# Patient Record
Sex: Female | Born: 1965 | Race: Black or African American | Hispanic: No | Marital: Married | Smoking: Never smoker
Health system: Southern US, Community
[De-identification: ages and names within clinical notes are randomized; demographics above are authoritative.]

## PROBLEM LIST (undated history)

## (undated) DIAGNOSIS — Z794 Long term (current) use of insulin: Secondary | ICD-10-CM

## (undated) DIAGNOSIS — A749 Chlamydial infection, unspecified: Secondary | ICD-10-CM

## (undated) DIAGNOSIS — Z862 Personal history of diseases of the blood and blood-forming organs and certain disorders involving the immune mechanism: Secondary | ICD-10-CM

## (undated) DIAGNOSIS — F419 Anxiety disorder, unspecified: Secondary | ICD-10-CM

## (undated) DIAGNOSIS — Z8619 Personal history of other infectious and parasitic diseases: Secondary | ICD-10-CM

## (undated) DIAGNOSIS — R002 Palpitations: Secondary | ICD-10-CM

## (undated) DIAGNOSIS — J40 Bronchitis, not specified as acute or chronic: Secondary | ICD-10-CM

## (undated) DIAGNOSIS — E785 Hyperlipidemia, unspecified: Secondary | ICD-10-CM

## (undated) DIAGNOSIS — E7800 Pure hypercholesterolemia, unspecified: Secondary | ICD-10-CM

## (undated) DIAGNOSIS — G43909 Migraine, unspecified, not intractable, without status migrainosus: Secondary | ICD-10-CM

## (undated) DIAGNOSIS — G5603 Carpal tunnel syndrome, bilateral upper limbs: Secondary | ICD-10-CM

## (undated) DIAGNOSIS — K219 Gastro-esophageal reflux disease without esophagitis: Secondary | ICD-10-CM

## (undated) DIAGNOSIS — B379 Candidiasis, unspecified: Secondary | ICD-10-CM

## (undated) DIAGNOSIS — D573 Sickle-cell trait: Secondary | ICD-10-CM

## (undated) DIAGNOSIS — J45909 Unspecified asthma, uncomplicated: Secondary | ICD-10-CM

## (undated) DIAGNOSIS — G4733 Obstructive sleep apnea (adult) (pediatric): Secondary | ICD-10-CM

## (undated) DIAGNOSIS — I10 Essential (primary) hypertension: Secondary | ICD-10-CM

## (undated) DIAGNOSIS — B009 Herpesviral infection, unspecified: Secondary | ICD-10-CM

## (undated) DIAGNOSIS — G2581 Restless legs syndrome: Secondary | ICD-10-CM

## (undated) DIAGNOSIS — E119 Type 2 diabetes mellitus without complications: Secondary | ICD-10-CM

## (undated) DIAGNOSIS — L309 Dermatitis, unspecified: Secondary | ICD-10-CM

## (undated) DIAGNOSIS — Z973 Presence of spectacles and contact lenses: Secondary | ICD-10-CM

## (undated) DIAGNOSIS — A549 Gonococcal infection, unspecified: Secondary | ICD-10-CM

## (undated) HISTORY — DX: Personal history of diseases of the blood and blood-forming organs and certain disorders involving the immune mechanism: Z86.2

## (undated) HISTORY — DX: Gastro-esophageal reflux disease without esophagitis: K21.9

## (undated) HISTORY — DX: Anxiety disorder, unspecified: F41.9

## (undated) HISTORY — DX: Migraine, unspecified, not intractable, without status migrainosus: G43.909

## (undated) HISTORY — PX: DILATION AND CURETTAGE OF UTERUS: SHX78

## (undated) HISTORY — DX: Pure hypercholesterolemia, unspecified: E78.00

## (undated) HISTORY — DX: Candidiasis, unspecified: B37.9

## (undated) HISTORY — DX: Essential (primary) hypertension: I10

## (undated) HISTORY — DX: Bronchitis, not specified as acute or chronic: J40

## (undated) HISTORY — PX: WISDOM TOOTH EXTRACTION: SHX21

---

## 1997-08-14 ENCOUNTER — Ambulatory Visit (HOSPITAL_COMMUNITY): Admission: RE | Admit: 1997-08-14 | Discharge: 1997-08-14 | Payer: Self-pay | Admitting: Family Medicine

## 1997-12-26 ENCOUNTER — Ambulatory Visit (HOSPITAL_COMMUNITY): Admission: RE | Admit: 1997-12-26 | Discharge: 1997-12-26 | Payer: Self-pay | Admitting: Obstetrics

## 1997-12-26 ENCOUNTER — Other Ambulatory Visit: Admission: RE | Admit: 1997-12-26 | Discharge: 1997-12-26 | Payer: Self-pay | Admitting: Obstetrics

## 1998-04-25 ENCOUNTER — Ambulatory Visit (HOSPITAL_COMMUNITY): Admission: RE | Admit: 1998-04-25 | Discharge: 1998-04-25 | Payer: Self-pay | Admitting: Obstetrics

## 1998-05-05 ENCOUNTER — Encounter: Payer: Self-pay | Admitting: Obstetrics

## 1998-05-05 ENCOUNTER — Ambulatory Visit (HOSPITAL_COMMUNITY): Admission: RE | Admit: 1998-05-05 | Discharge: 1998-05-05 | Payer: Self-pay | Admitting: Obstetrics

## 2000-10-19 ENCOUNTER — Ambulatory Visit (HOSPITAL_BASED_OUTPATIENT_CLINIC_OR_DEPARTMENT_OTHER): Admission: RE | Admit: 2000-10-19 | Discharge: 2000-10-19 | Payer: Self-pay | Admitting: Internal Medicine

## 2000-11-30 ENCOUNTER — Ambulatory Visit (HOSPITAL_BASED_OUTPATIENT_CLINIC_OR_DEPARTMENT_OTHER): Admission: RE | Admit: 2000-11-30 | Discharge: 2000-11-30 | Payer: Self-pay | Admitting: Family Medicine

## 2002-08-22 ENCOUNTER — Other Ambulatory Visit: Admission: RE | Admit: 2002-08-22 | Discharge: 2002-08-22 | Payer: Self-pay | Admitting: Family Medicine

## 2002-10-04 ENCOUNTER — Emergency Department (HOSPITAL_COMMUNITY): Admission: EM | Admit: 2002-10-04 | Discharge: 2002-10-04 | Payer: Self-pay | Admitting: Emergency Medicine

## 2003-04-11 ENCOUNTER — Ambulatory Visit (HOSPITAL_COMMUNITY): Admission: RE | Admit: 2003-04-11 | Discharge: 2003-04-11 | Payer: Self-pay | Admitting: Obstetrics

## 2003-05-27 ENCOUNTER — Ambulatory Visit (HOSPITAL_COMMUNITY): Admission: RE | Admit: 2003-05-27 | Discharge: 2003-05-27 | Payer: Self-pay | Admitting: Obstetrics

## 2003-12-27 ENCOUNTER — Inpatient Hospital Stay (HOSPITAL_COMMUNITY): Admission: AD | Admit: 2003-12-27 | Discharge: 2003-12-30 | Payer: Self-pay | Admitting: Obstetrics

## 2004-12-14 ENCOUNTER — Emergency Department (HOSPITAL_COMMUNITY): Admission: EM | Admit: 2004-12-14 | Discharge: 2004-12-14 | Payer: Self-pay | Admitting: Emergency Medicine

## 2005-09-20 ENCOUNTER — Emergency Department (HOSPITAL_COMMUNITY): Admission: EM | Admit: 2005-09-20 | Discharge: 2005-09-20 | Payer: Self-pay | Admitting: Emergency Medicine

## 2006-10-06 ENCOUNTER — Ambulatory Visit (HOSPITAL_COMMUNITY): Admission: RE | Admit: 2006-10-06 | Discharge: 2006-10-06 | Payer: Self-pay | Admitting: Obstetrics

## 2007-07-22 IMAGING — CR DG ANKLE COMPLETE 3+V*L*
3 series · 3 of 3 positions shown · non-contrast
Comparison: none

CLINICAL DATA: Patient has left ankle pain.  
 LEFT ANKLE ? 3 VIEW:

[view not recorded (1 of 3)]
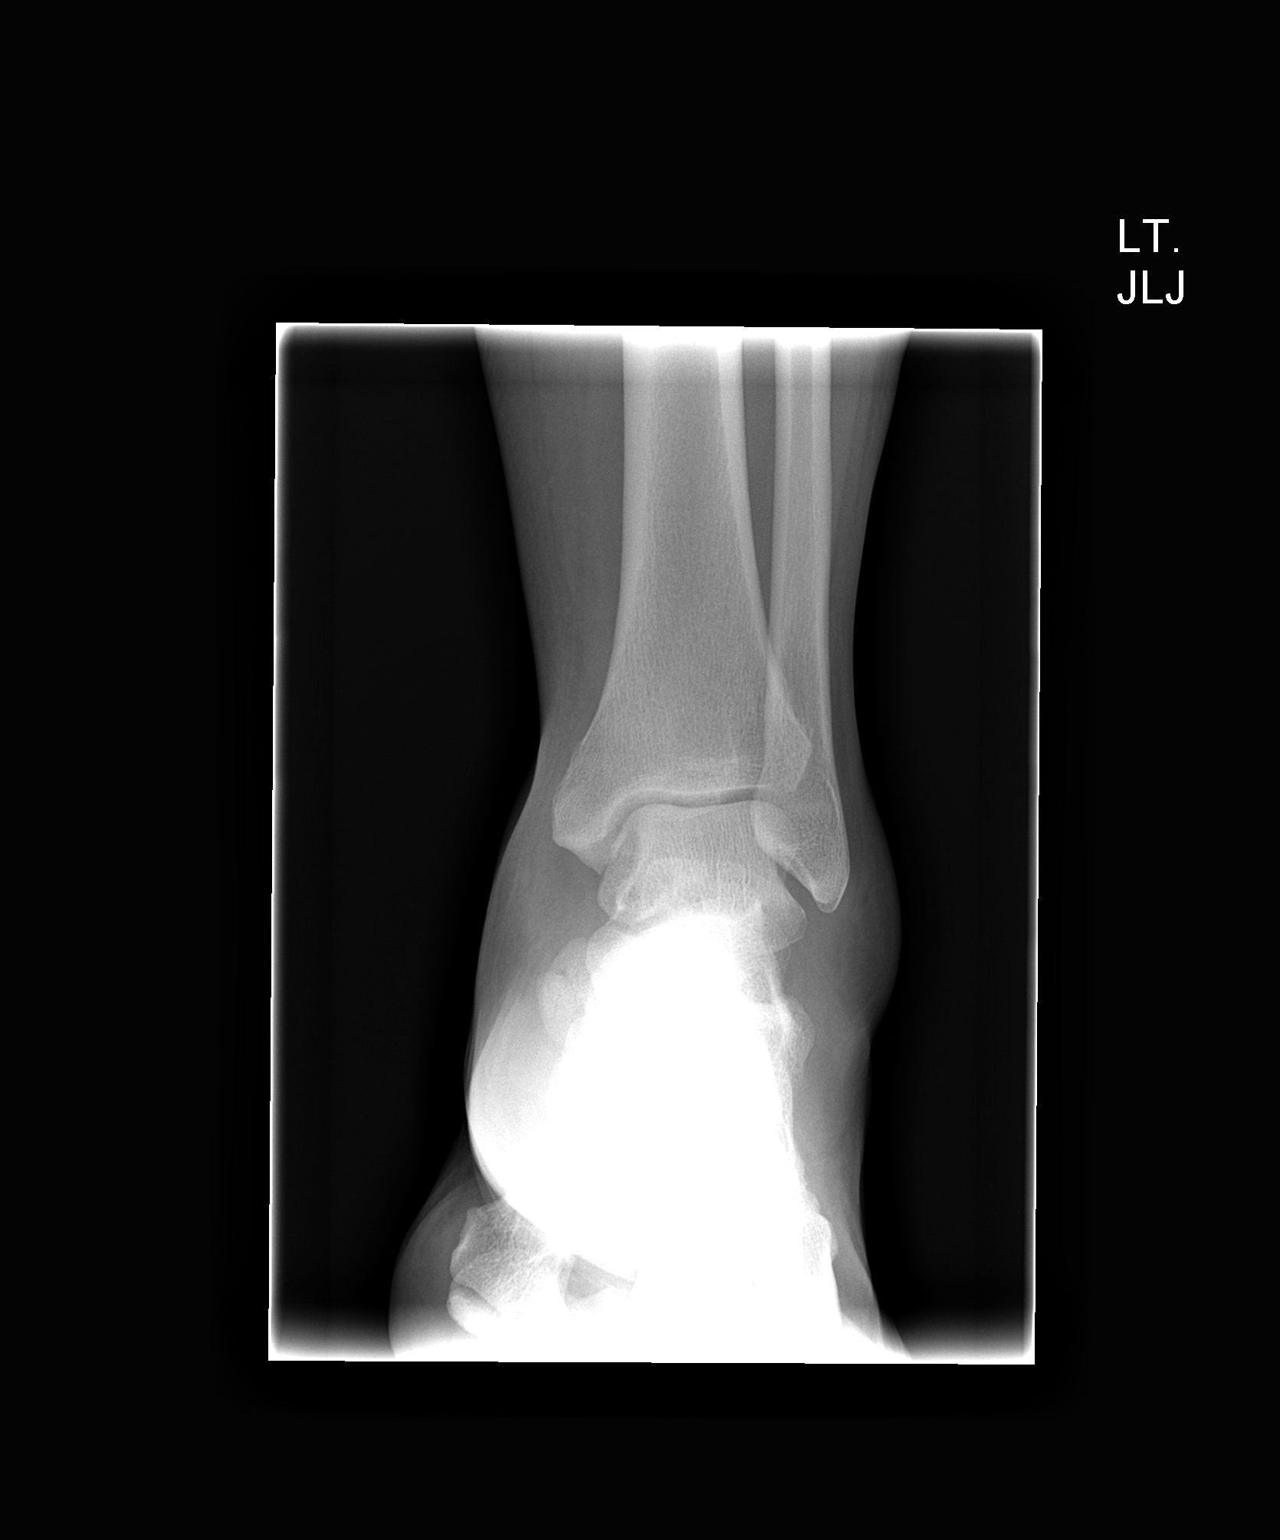

[view not recorded (2 of 3)]
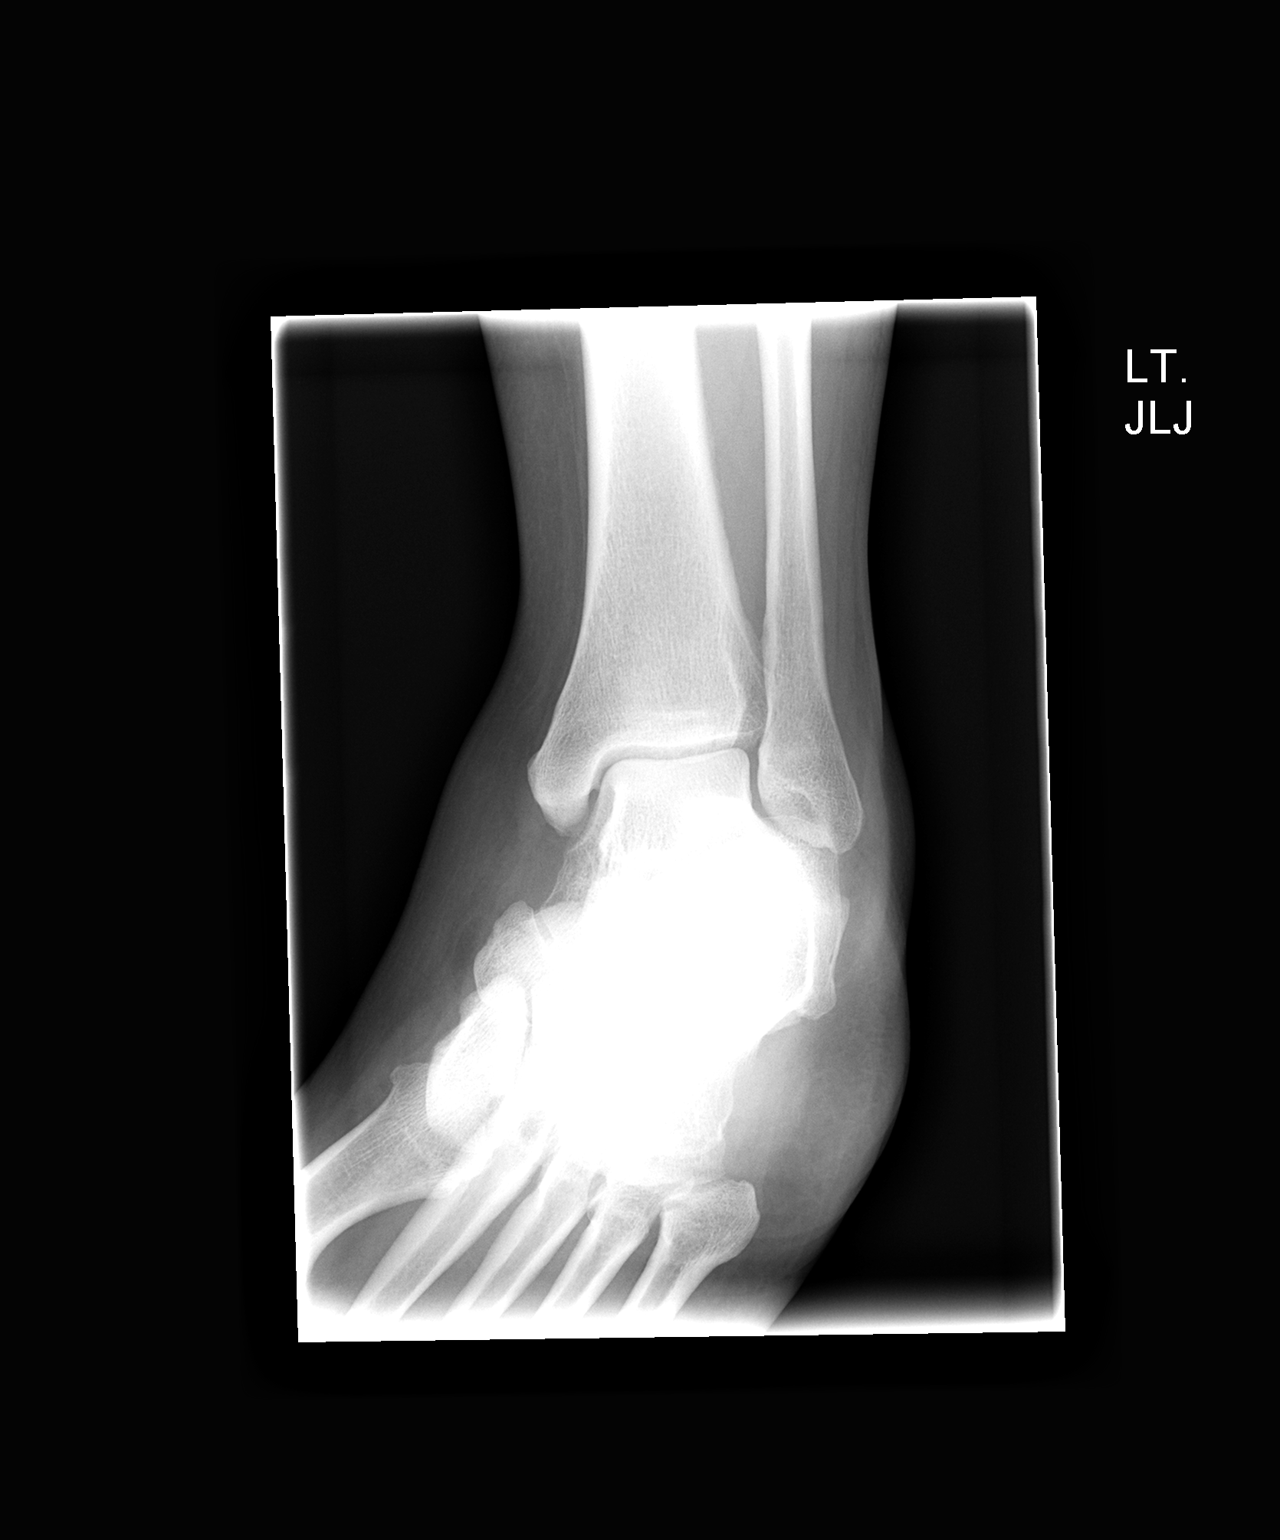

[view not recorded (3 of 3)]
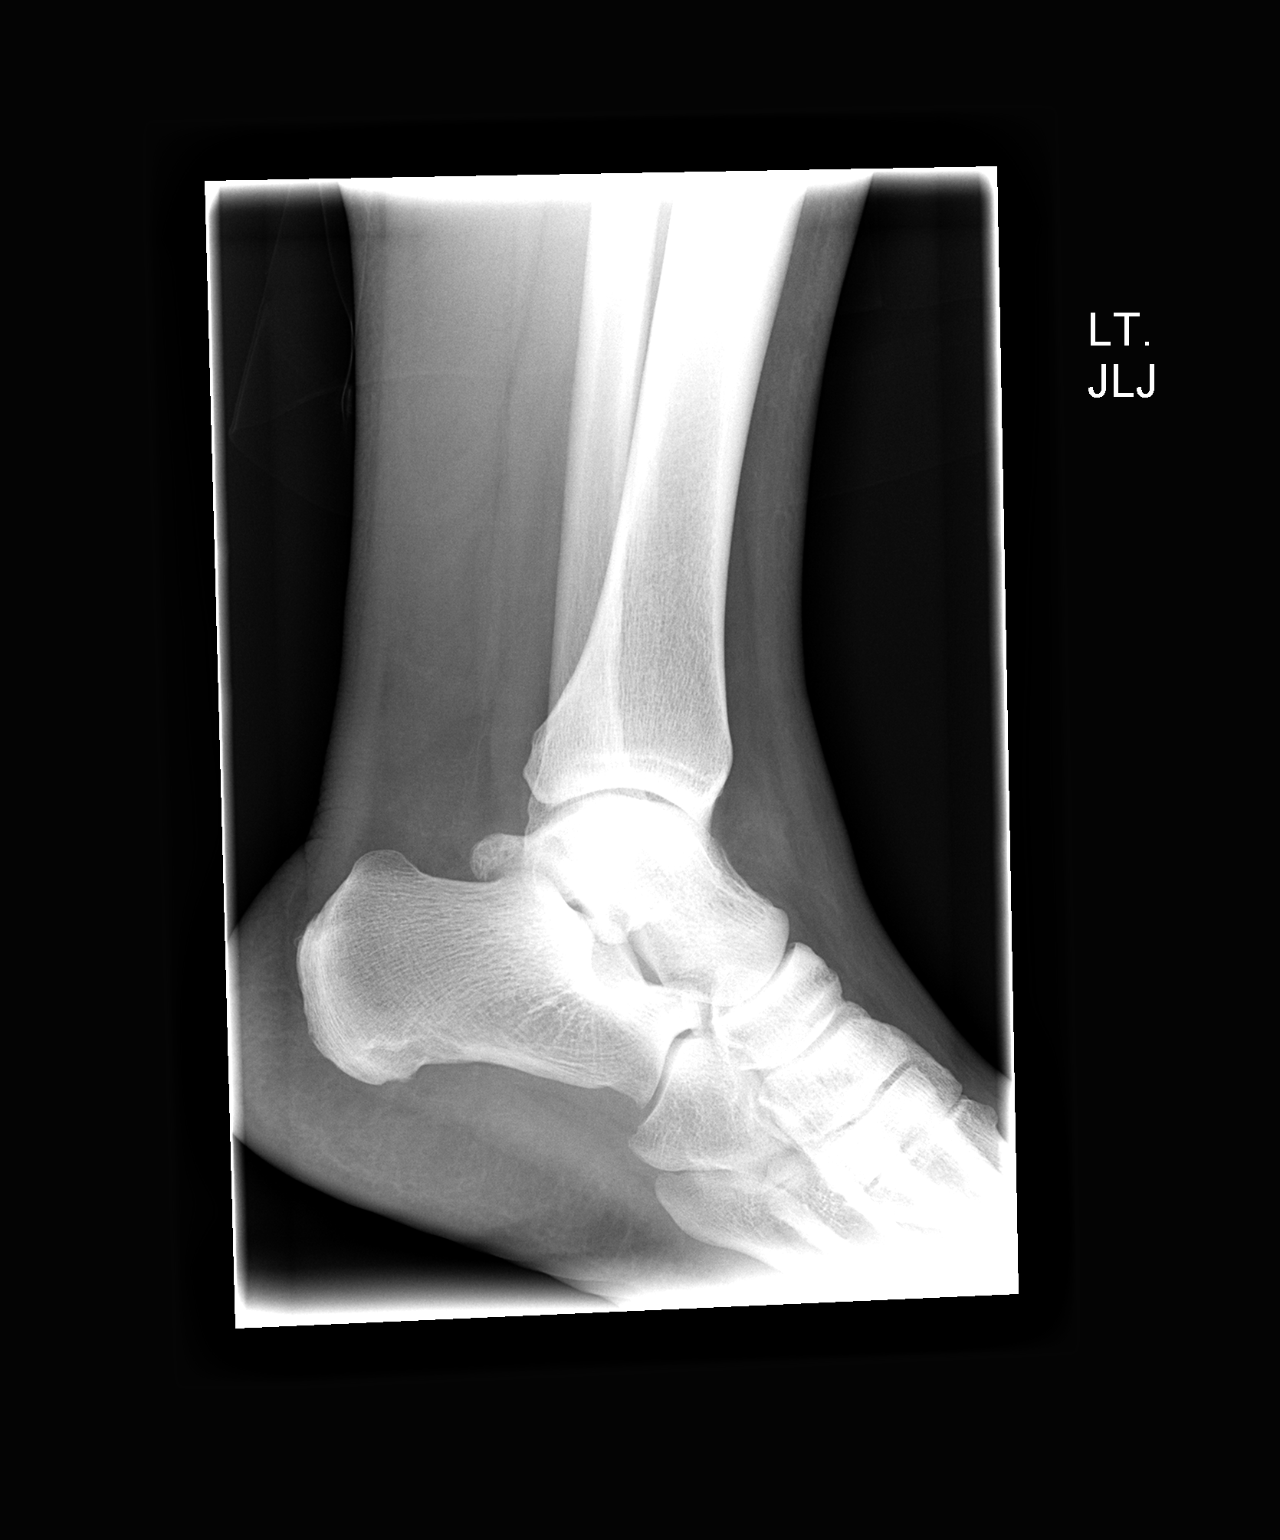

[3 of 3 positions shown; findings below may reference images not displayed]

FINDINGS: There is soft tissue swelling particularly laterally.  No evidence of fracture.
IMPRESSION: No fracture.

## 2008-10-28 ENCOUNTER — Encounter: Admission: RE | Admit: 2008-10-28 | Discharge: 2008-10-28 | Payer: Self-pay | Admitting: Family Medicine

## 2009-04-17 ENCOUNTER — Ambulatory Visit (HOSPITAL_COMMUNITY): Admission: RE | Admit: 2009-04-17 | Discharge: 2009-04-17 | Payer: Self-pay | Admitting: Obstetrics

## 2010-04-28 ENCOUNTER — Other Ambulatory Visit (HOSPITAL_COMMUNITY): Payer: Self-pay | Admitting: Obstetrics

## 2010-04-28 DIAGNOSIS — Z1231 Encounter for screening mammogram for malignant neoplasm of breast: Secondary | ICD-10-CM

## 2010-05-07 ENCOUNTER — Ambulatory Visit (HOSPITAL_COMMUNITY)
Admission: RE | Admit: 2010-05-07 | Discharge: 2010-05-07 | Disposition: A | Discharge status: Home / Self Care | Payer: BC Managed Care – PPO | Source: Ambulatory Visit | Attending: Obstetrics | Admitting: Obstetrics

## 2010-05-07 DIAGNOSIS — Z1231 Encounter for screening mammogram for malignant neoplasm of breast: Secondary | ICD-10-CM | POA: Insufficient documentation

## 2010-05-13 ENCOUNTER — Emergency Department (HOSPITAL_COMMUNITY): Payer: BC Managed Care – PPO

## 2010-05-13 ENCOUNTER — Emergency Department (HOSPITAL_COMMUNITY)
Admission: EM | Admit: 2010-05-13 | Discharge: 2010-05-13 | Disposition: A | Discharge status: Home / Self Care | Payer: BC Managed Care – PPO | Attending: Emergency Medicine | Admitting: Emergency Medicine

## 2010-05-13 DIAGNOSIS — B9789 Other viral agents as the cause of diseases classified elsewhere: Secondary | ICD-10-CM | POA: Insufficient documentation

## 2010-05-13 DIAGNOSIS — E785 Hyperlipidemia, unspecified: Secondary | ICD-10-CM | POA: Insufficient documentation

## 2010-05-13 DIAGNOSIS — R0789 Other chest pain: Secondary | ICD-10-CM | POA: Insufficient documentation

## 2010-05-13 DIAGNOSIS — E119 Type 2 diabetes mellitus without complications: Secondary | ICD-10-CM | POA: Insufficient documentation

## 2010-05-13 DIAGNOSIS — I252 Old myocardial infarction: Secondary | ICD-10-CM | POA: Insufficient documentation

## 2010-05-13 DIAGNOSIS — R509 Fever, unspecified: Secondary | ICD-10-CM | POA: Insufficient documentation

## 2010-05-13 DIAGNOSIS — J984 Other disorders of lung: Secondary | ICD-10-CM | POA: Insufficient documentation

## 2010-05-13 DIAGNOSIS — IMO0001 Reserved for inherently not codable concepts without codable children: Secondary | ICD-10-CM | POA: Insufficient documentation

## 2010-05-13 DIAGNOSIS — R5381 Other malaise: Secondary | ICD-10-CM | POA: Insufficient documentation

## 2010-05-13 LAB — POCT CARDIAC MARKERS
CKMB, poc: <1 ng/mL — ABNORMAL LOW (ref 1.0–8.0)
Myoglobin, poc: 43 ng/mL (ref 12–200)

## 2010-05-13 LAB — URINALYSIS, ROUTINE W REFLEX MICROSCOPIC
Glucose, UA: 100 mg/dL — AB
Hgb urine dipstick: NEGATIVE
Ketones, ur: NEGATIVE mg/dL
Nitrite: NEGATIVE
Protein, ur: NEGATIVE mg/dL
Urobilinogen, UA: 0.2 mg/dL (ref 0.0–1.0)

## 2010-05-13 LAB — D-DIMER, QUANTITATIVE: D-Dimer, Quant: 1.1 ug/mL-FEU — ABNORMAL HIGH (ref 0.00–0.48)

## 2010-05-13 LAB — DIFFERENTIAL
Basophils Absolute: 0 10*3/uL (ref 0.0–0.1)
Basophils Relative: 1 % (ref 0–1)
Lymphocytes Relative: 16 % (ref 12–46)
Lymphs Abs: 0.6 10*3/uL — ABNORMAL LOW (ref 0.7–4.0)
Monocytes Absolute: 0.3 10*3/uL (ref 0.1–1.0)
Monocytes Relative: 8 % (ref 3–12)
Neutro Abs: 2.6 10*3/uL (ref 1.7–7.7)
Neutrophils Relative %: 72 % (ref 43–77)

## 2010-05-13 LAB — COMPREHENSIVE METABOLIC PANEL
Albumin: 3.4 g/dL — ABNORMAL LOW (ref 3.5–5.2)
Alkaline Phosphatase: 46 U/L (ref 39–117)
CO2: 26 mEq/L (ref 19–32)
Calcium: 8.7 mg/dL (ref 8.4–10.5)
GFR calc non Af Amer: >60 mL/min (ref >60)
Sodium: 132 mEq/L — ABNORMAL LOW (ref 135–145)
Total Bilirubin: 0.3 mg/dL (ref 0.3–1.2)
Total Protein: 7.2 g/dL (ref 6.0–8.3)

## 2010-05-13 LAB — CBC
HCT: 37.6 % (ref 36.0–46.0)
Platelets: 263 10*3/uL (ref 150–400)
RDW: 13.4 % (ref 11.5–15.5)
WBC: 3.6 10*3/uL — ABNORMAL LOW (ref 4.0–10.5)

## 2010-05-13 MED ORDER — IOHEXOL 350 MG/ML SOLN
80.0000 mL | Freq: Once | INTRAVENOUS | Status: AC | PRN
  Administered 2010-05-13: 80 mL via INTRAVENOUS

## 2010-05-15 ENCOUNTER — Emergency Department (HOSPITAL_COMMUNITY)
Admission: EM | Admit: 2010-05-15 | Discharge: 2010-05-15 | Disposition: A | Discharge status: Home / Self Care | Payer: BC Managed Care – PPO | Attending: Emergency Medicine | Admitting: Emergency Medicine

## 2010-05-15 DIAGNOSIS — I252 Old myocardial infarction: Secondary | ICD-10-CM | POA: Insufficient documentation

## 2010-05-15 DIAGNOSIS — E119 Type 2 diabetes mellitus without complications: Secondary | ICD-10-CM | POA: Insufficient documentation

## 2010-05-15 DIAGNOSIS — R42 Dizziness and giddiness: Secondary | ICD-10-CM | POA: Insufficient documentation

## 2010-05-15 DIAGNOSIS — R112 Nausea with vomiting, unspecified: Secondary | ICD-10-CM | POA: Insufficient documentation

## 2010-05-15 DIAGNOSIS — R5381 Other malaise: Secondary | ICD-10-CM | POA: Insufficient documentation

## 2010-05-15 DIAGNOSIS — IMO0001 Reserved for inherently not codable concepts without codable children: Secondary | ICD-10-CM | POA: Insufficient documentation

## 2010-05-15 DIAGNOSIS — R5383 Other fatigue: Secondary | ICD-10-CM | POA: Insufficient documentation

## 2010-05-15 DIAGNOSIS — E785 Hyperlipidemia, unspecified: Secondary | ICD-10-CM | POA: Insufficient documentation

## 2010-05-15 DIAGNOSIS — R51 Headache: Secondary | ICD-10-CM | POA: Insufficient documentation

## 2010-05-19 LAB — ROCKY MTN SPOTTED FVR AB, IGG-BLOOD: RMSF IgG: 0.05 IV

## 2010-05-19 LAB — ROCKY MTN SPOTTED FVR AB, IGM-BLOOD: RMSF IgM: 0.63 IV (ref 0.00–0.89)

## 2010-07-17 NOTE — Op Note (Signed)
[name], BOUIE NO.:  [id number]   MEDICAL RECORD NO.:  [id number]          PATIENT TYPE:  INP   LOCATION:  9119                          FACILITY:  [facility]   PHYSICIAN:  Kathreen Cosier, M.D.DATE OF BIRTH:  [birth year]   DATE OF PROCEDURE:  12/27/2003  DATE OF DISCHARGE:                                 OPERATIVE REPORT   PREOPERATIVE DIAGNOSES:  Breech at 36 plus weeks with ruptured membranes,  labor.   POSTOPERATIVE DIAGNOSES:  Breech at 36 plus weeks with ruptured membranes,  labor.   SURGEON:  Kathreen Cosier, M.D.   FIRST ASSISTANT:  Charles A. Clearance Coots, M.D.   ANESTHESIA:  Spinal.   DESCRIPTION OF PROCEDURE:  The patient placed on the operating table in the  supine position after the spinal administered, the abdomen prepped and  draped, bladder emptied with a Foley catheter. A transverse suprapubic  incision made carried down to the fascia, fascia cleaned and incised the  length of the incision. The recti muscles retracted laterally, peritoneum  incised longitudinally. A transverse incision made in the visceroperitoneum  above the bladder, the bladder __________.  A transverse lower uterine  incision made and the patient was delivered of a double-footling breech  female, Apgar 5 and 9.  The fluid was clear, the team was in attendance.  The baby weighed 6 pounds 3 ounces.  The placenta was removed manually. The  uterine cavity cleaned with dry laps. The uterine incision closed with one  layer of continuous suture of #1 chromic. Hemostasis was satisfactory,  bladder flap reattached with 2-0 chromic. The uterus was well contracted,  tubes and ovaries normal, abdomen closed in layers, peritoneum continuous  suture of #0 chromic, fascia continuous suture of #0 Dexon and the skin  closed with subcuticular sutures of 4-0 Monocryl. Blood loss 500 mL.      BAM/MEDQ  D:  12/27/2003  T:  12/27/2003  Job:  [id number]

## 2010-07-17 NOTE — Discharge Summary (Signed)
NAMEYASMEN, CORTNER NO.:  [id number]   MEDICAL RECORD NO.:  [id number]          PATIENT TYPE:  INP   LOCATION:  9119                          FACILITY:  [facility]   PHYSICIAN:  Kathreen Cosier, M.D.DATE OF BIRTH:  [birth year]   DATE OF ADMISSION:  12/27/2003  DATE OF DISCHARGE:  12/30/2003                                 DISCHARGE SUMMARY   HISTORY OF PRESENT ILLNESS:  The patient is a 45 year old gravida 4, para 1-  0-2-1, [facility] January 22, 2004.  She was admitted with rupture of membranes  that occurred at 3:30 a.m. on the day of admission.  The fluid was clear.  The cervix was 1 cm, 70%, and the presenting part was floating.  It was  decided that she should have a ultrasound for presentation and ultrasound  revealed a breech presentation.  She was also contracting irregularly.  She  had a positive GBS.   LABORATORY DATA:  On admission her hemoglobin 12.2, platelets 299, and white  count 11.1.  RPR negative.  Urinalysis negative.   HOSPITAL COURSE:  The patient was delivered by primary low transverse  cesarean section because of breech presentation with rupture of membranes at  term.  She did have a double footling breech female Apgars 5 and 9 weighing  6 pounds 3 ounces.  Postoperatively, she did well, her hemoglobin was 10,  and she was discharged on the third postoperative day ambulatory and on a  regular diet, to see me in six weeks.   DISCHARGE MEDICATIONS:  1.  Micronor one p.o. daily.  2.  Tylox.   DISCHARGE DIAGNOSES:  1.  Status post primary low transverse cesarean section at 38 weeks.  2.  Premature rupture of membranes.  3.  Labor.  4.  Double footling breech.      BAM/MEDQ  D:  12/30/2003  T:  12/30/2003  Job:  [id number]

## 2010-07-17 NOTE — H&P (Signed)
NAMEAVELYNN, Angela Mitchell NO.:  [id number]   MEDICAL RECORD NO.:  [id number]          PATIENT TYPE:  INP   LOCATION:  9119                          [facility]ACILITY:  [facility]   PHYSICIAN:  Kathreen Cosier, M.D.DATE OF BIRTH:  03/09/1965   DATE OF ADMISSION:  12/27/2003  DATE OF DISCHARGE:                                HISTORY & PHYSICAL   HISTORY OF PRESENT ILLNESS:  The patient is a 45 year old gravida 4 para 1-0-  2-1 with [facility] January 22, 2004.  Her membranes ruptured spontaneously at  3:30 a.m. today; the fluid was clear.  On admission, she was having  occasional contractions.  The cervix was 1 cm, 70%, and the presenting part  was floating.  An ultrasound was performed for the presentation and it  revealed a footling breech.  She had a positive GBS and she received  penicillin.  Because of the breech presentation and ruptured membranes in  early labor, it was decided she would be delivered by C-section.   PHYSICAL EXAMINATION:  GENERAL:  Revealed a well-developed female weighing  238 pounds.  HEENT:  Negative.  LUNGS:  Clear.  HEART:  Regular rhythm, no murmurs, no gallops.  BREASTS:  No masses.  ABDOMEN:  Term size.  Fetal heart 153.  PELVIC:  As described above.  EXTREMITIES:  Negative.      BAM/MEDQ  D:  12/27/2003  T:  12/27/2003  Job:  [id number]

## 2010-11-13 ENCOUNTER — Other Ambulatory Visit: Payer: Self-pay | Admitting: Internal Medicine

## 2010-11-13 DIAGNOSIS — R911 Solitary pulmonary nodule: Secondary | ICD-10-CM

## 2010-11-18 ENCOUNTER — Ambulatory Visit
Admission: RE | Admit: 2010-11-18 | Discharge: 2010-11-18 | Disposition: A | Discharge status: Home / Self Care | Payer: BC Managed Care – PPO | Source: Ambulatory Visit | Attending: Internal Medicine | Admitting: Internal Medicine

## 2010-11-18 DIAGNOSIS — R911 Solitary pulmonary nodule: Secondary | ICD-10-CM

## 2011-03-02 DIAGNOSIS — G43909 Migraine, unspecified, not intractable, without status migrainosus: Secondary | ICD-10-CM

## 2011-03-02 HISTORY — DX: Migraine, unspecified, not intractable, without status migrainosus: G43.909

## 2011-03-02 HISTORY — PX: DILATION AND CURETTAGE OF UTERUS: SHX78

## 2011-03-10 ENCOUNTER — Other Ambulatory Visit (HOSPITAL_COMMUNITY): Payer: Self-pay | Admitting: Obstetrics

## 2011-03-10 DIAGNOSIS — O3680X0 Pregnancy with inconclusive fetal viability, not applicable or unspecified: Secondary | ICD-10-CM

## 2011-03-15 ENCOUNTER — Ambulatory Visit (HOSPITAL_COMMUNITY)
Admission: RE | Admit: 2011-03-15 | Discharge: 2011-03-15 | Disposition: A | Discharge status: Home / Self Care | Payer: BC Managed Care – PPO | Source: Ambulatory Visit | Attending: Obstetrics | Admitting: Obstetrics

## 2011-03-15 DIAGNOSIS — Z3689 Encounter for other specified antenatal screening: Secondary | ICD-10-CM | POA: Insufficient documentation

## 2011-03-15 DIAGNOSIS — O3680X0 Pregnancy with inconclusive fetal viability, not applicable or unspecified: Secondary | ICD-10-CM

## 2011-03-17 ENCOUNTER — Other Ambulatory Visit (HOSPITAL_COMMUNITY): Payer: Self-pay | Admitting: Obstetrics

## 2011-03-17 DIAGNOSIS — O3680X0 Pregnancy with inconclusive fetal viability, not applicable or unspecified: Secondary | ICD-10-CM

## 2011-03-25 ENCOUNTER — Ambulatory Visit (HOSPITAL_COMMUNITY)
Admission: RE | Admit: 2011-03-25 | Discharge: 2011-03-25 | Disposition: A | Discharge status: Home / Self Care | Payer: BC Managed Care – PPO | Source: Ambulatory Visit | Attending: Obstetrics | Admitting: Obstetrics

## 2011-03-25 ENCOUNTER — Encounter: Payer: BC Managed Care – PPO | Attending: Endocrinology | Admitting: Dietician

## 2011-03-25 ENCOUNTER — Encounter: Payer: Self-pay | Admitting: Dietician

## 2011-03-25 DIAGNOSIS — O24919 Unspecified diabetes mellitus in pregnancy, unspecified trimester: Secondary | ICD-10-CM | POA: Insufficient documentation

## 2011-03-25 DIAGNOSIS — Z713 Dietary counseling and surveillance: Secondary | ICD-10-CM | POA: Insufficient documentation

## 2011-03-25 DIAGNOSIS — E119 Type 2 diabetes mellitus without complications: Secondary | ICD-10-CM | POA: Insufficient documentation

## 2011-03-25 DIAGNOSIS — O3680X0 Pregnancy with inconclusive fetal viability, not applicable or unspecified: Secondary | ICD-10-CM

## 2011-03-25 DIAGNOSIS — Z3689 Encounter for other specified antenatal screening: Secondary | ICD-10-CM | POA: Insufficient documentation

## 2011-03-25 NOTE — Progress Notes (Signed)
Has DM type 2 and is diagnosed with pregnancy.  HgA1C was "7 something in November and now with pregnancy she is >9.5%".  She is stressed that she wanted the pregnancy and was not aware that she needed to have the low A1C and to be working with her physician to closely monitor glucose levels to prevent the issues involved in fetal growth that can accompany high glucose levels.  We reviewed the diet and placed her on 30 gm of carb for meals and 15 gm of carb for snacks.  This is using a 3 meals and 3 snack meal pattern and monitoring her blood glucose levels at the fasting and 2 hours after the first bite of each meal.  She is currently on Glumetza 100 mg BID, Humalog mix insulin 75/25; 15 units before breakfast and 20 units before dinner.  She does not like milk and I suggested a calcium, 600 mg with vitamin D twice daily.  Patient was seen on 03/25/2011 for  Diabetes and Pregnancy self-management 1:1 session at [facility]. The following learning objectives were met by the patient during this course:   States the definition of Gestational Diabetes  States why dietary management is important in controlling blood glucose  Describes the effects each nutrient has on blood glucose levels  Demonstrates ability to create a balanced meal plan  Demonstrates carbohydrate counting   States when to check blood glucose levels  Demonstrates proper blood glucose monitoring techniques  States the effect of stress and exercise on blood glucose levels  States the importance of limiting caffeine and abstaining from alcohol and smoking  Blood glucose monitor given: Has her own meter a One Touch Ultra 2.  Does need a prescription for strips and lancets.  To call Dr. Gaynell Face tomorrow for the prescription.  Patient instructed to monitor glucose levels: Fasting and 2 hours after the first bite of every meal. FBS: 60 - <90 1 hour: <140 2 hour: <120  Patient received  handouts:  Nutrition Diabetes and Pregnancy  Carbohydrate Counting List  Patient will be seen for follow-up as needed.

## 2011-03-29 ENCOUNTER — Other Ambulatory Visit (HOSPITAL_COMMUNITY): Payer: Self-pay | Admitting: Obstetrics

## 2011-03-29 DIAGNOSIS — O3680X0 Pregnancy with inconclusive fetal viability, not applicable or unspecified: Secondary | ICD-10-CM

## 2011-04-03 ENCOUNTER — Inpatient Hospital Stay (HOSPITAL_COMMUNITY): Payer: BC Managed Care – PPO

## 2011-04-03 ENCOUNTER — Inpatient Hospital Stay (HOSPITAL_COMMUNITY)
Admission: AD | Admit: 2011-04-03 | Discharge: 2011-04-03 | Disposition: A | Discharge status: Home / Self Care | Payer: BC Managed Care – PPO | Source: Ambulatory Visit | Attending: Obstetrics | Admitting: Obstetrics

## 2011-04-03 ENCOUNTER — Other Ambulatory Visit: Payer: Self-pay | Admitting: Obstetrics

## 2011-04-03 ENCOUNTER — Encounter (HOSPITAL_COMMUNITY): Payer: Self-pay | Admitting: *Deleted

## 2011-04-03 DIAGNOSIS — O039 Complete or unspecified spontaneous abortion without complication: Secondary | ICD-10-CM

## 2011-04-03 DIAGNOSIS — O034 Incomplete spontaneous abortion without complication: Secondary | ICD-10-CM | POA: Insufficient documentation

## 2011-04-03 HISTORY — DX: Chlamydial infection, unspecified: A74.9

## 2011-04-03 HISTORY — DX: Herpesviral infection, unspecified: B00.9

## 2011-04-03 HISTORY — DX: Gonococcal infection, unspecified: A54.9

## 2011-04-03 LAB — CBC
MCH: 25.9 pg — ABNORMAL LOW (ref 26.0–34.0)
MCHC: 32.1 g/dL (ref 30.0–36.0)
MCV: 80.5 fL (ref 78.0–100.0)
Platelets: 401 10*3/uL — ABNORMAL HIGH (ref 150–400)
RBC: 4.37 MIL/uL (ref 3.87–5.11)

## 2011-04-03 MED ORDER — RHO D IMMUNE GLOBULIN 1500 UNIT/2ML IJ SOLN
300.0000 ug | Freq: Once | INTRAMUSCULAR | Status: AC
  Administered 2011-04-03: 300 ug via INTRAMUSCULAR

## 2011-04-03 MED ORDER — ACETAMINOPHEN-CODEINE #3 300-30 MG PO TABS
2.0000 | ORAL_TABLET | ORAL | Status: DC | PRN
  Administered 2011-04-03: 2 via ORAL
  Filled 2011-04-03: qty 2

## 2011-04-03 NOTE — ED Provider Notes (Signed)
Chief Complaint:  Vaginal Bleeding   Angela Mitchell is  46 y.o. [id number].  Patient's last menstrual period was 02/03/2011.Marland Kitchen  Her pregnancy status is positive.  She presents complaining of Vaginal Bleeding .  Began having slight bleeding described as a brown smudge yesterday and went home form work on bedrest. Today at 1300 began having severe lower abdominal cramping and more bleeding which became heavy with clots en route to hospital. No orthostatic sx.   On 03/15/2011 she was seen here for an ultrasound due to unknown dating. She had a positive gestational sac, positive yolk sac, no embryo. Sac size was consistent with 5 weeks 4 days. Then on 03/24/2010 she had a followup ultrasound when she should have been 7 weeks by her initial ultrasound the gestational sac was consistent with 6 week 1 day and there was no yolk sac, double decidual sign and no fetal pole.   Past Medical History  Diagnosis Date  . Diabetes mellitus   . Chlamydia   . Gonorrhea   . Herpes     Past Surgical History  Procedure Date  . Cesarean section   . Dilation and curettage of uterus   . Wisdom tooth extraction     Family History  Problem Relation Age of Onset  . Diabetes Mother   . Hypertension Father   . Hyperlipidemia Father   . Diabetes Maternal Grandmother     History  Substance Use Topics  . Smoking status: Never Smoker   . Smokeless tobacco: Never Used  . Alcohol Use: No    Allergies: No Known Allergies  Prescriptions prior to admission  Medication Sig Dispense Refill  . insulin lispro protamine-insulin lispro (HUMALOG 75/25) (75-25) 100 UNIT/ML SUSP Inject 20 Units into the skin daily with breakfast. Humalog mix, 20 units before breakfast and 25 units before dinner      . metFORMIN (GLUMETZA) 1000 MG (MOD) 24 hr tablet Take 1,000 mg by mouth 2 (two) times daily with a meal.      . Prenatal Vit-Fe Fumarate-FA (PRENATAL MULTIVITAMIN) TABS Take 1 tablet by mouth daily.        Review of  Systems - Negative except as noted  Physical Exam   Blood pressure 104/59, pulse 71, temperature 98.8 F (37.1 C), temperature source Oral, resp. rate 18, height 5\' 3"  (1.6 m), weight 113.581 kg (250 lb 6.4 oz), last menstrual period 02/03/2011.  General: General appearance - alert, well appearing, and in no distress Abdomen - soft, tenderness noted suprapubic Pelvic , SPEC: moderate size clots at introitus, several 3-4 cm clots removed via sponge forceps,4 cm clot with small fragment white tissue Cx: 1 cm/ long Extremities - peripheral pulses normal, no pedal edema, no clubbing or cyanosis, no pedal edema noted   Results for orders placed during the hospital encounter of 04/03/11 (from the past 24 hour(s))  CBC     Status: Abnormal   Collection Time   04/03/11  3:18 PM      Component Value Range   WBC 11.1 (*) 4.0 - 10.5 (K/uL)   RBC 4.37  3.87 - 5.11 (MIL/uL)   Hemoglobin 11.3 (*) 12.0 - 15.0 (g/dL)   HCT 45.4 (*) 09.8 - 46.0 (%)   MCV 80.5  78.0 - 100.0 (fL)   MCH 25.9 (*) 26.0 - 34.0 (pg)   MCHC 32.1  30.0 - 36.0 (g/dL)   RDW 11.9  14.7 - 82.9 (%)   Platelets 401 (*) 150 - 400 (K/uL)  HCG, QUANTITATIVE,  PREGNANCY     Status: Abnormal   Collection Time   04/03/11  3:19 PM      Component Value Range   hCG, Beta Chain, Quant, S 5968 (*) <5 (mIU/mL)    ABO/RH pending  Assessment: 46 yo [id number] @[redacted]w[redacted]d  by initial [place] Incomplete SAB, hemodynamically stable ll DM  Plan: C/W Dr. Gaynell Face who will come see pt.  Edwyna Dangerfield

## 2011-04-03 NOTE — Progress Notes (Signed)
Pt reports having some vaginal bleeding on Thursday brown and mucusy. Bleeding and cramping got worse today. More dark red and bloody that started today and passed a large clot in BR while she was waiting to be triaged.

## 2011-04-03 NOTE — Progress Notes (Signed)
Had u/s and is being followed in Dr. Elsie Stain office, and is to have a repeat u/s on this coming Tuesday.  Brown/red bleeding when wiping yesterday, was told to rest all day.  Bleeding mod amt bright red at this time.

## 2011-04-04 LAB — RH IG WORKUP (INCLUDES ABO/RH)
ABO/RH(D): A NEG
Antibody Screen: NEGATIVE
Fetal Screen: NEGATIVE
Gestational Age(Wks): 22.3
Unit division: 0

## 2011-04-06 ENCOUNTER — Ambulatory Visit (HOSPITAL_COMMUNITY): Payer: BC Managed Care – PPO | Attending: Obstetrics

## 2011-07-28 ENCOUNTER — Ambulatory Visit: Payer: BC Managed Care – PPO | Admitting: Dietician

## 2011-09-21 ENCOUNTER — Ambulatory Visit (INDEPENDENT_AMBULATORY_CARE_PROVIDER_SITE_OTHER): Payer: BC Managed Care – PPO | Admitting: Obstetrics and Gynecology

## 2011-09-21 ENCOUNTER — Encounter: Payer: Self-pay | Admitting: Obstetrics and Gynecology

## 2011-09-21 VITALS — BP 140/80 | HR 82 | Ht 63.5 in | Wt 250.0 lb

## 2011-09-21 DIAGNOSIS — R102 Pelvic and perineal pain: Secondary | ICD-10-CM

## 2011-09-21 DIAGNOSIS — N949 Unspecified condition associated with female genital organs and menstrual cycle: Secondary | ICD-10-CM

## 2011-09-21 DIAGNOSIS — Z01419 Encounter for gynecological examination (general) (routine) without abnormal findings: Secondary | ICD-10-CM

## 2011-09-21 DIAGNOSIS — N926 Irregular menstruation, unspecified: Secondary | ICD-10-CM

## 2011-09-21 DIAGNOSIS — Z124 Encounter for screening for malignant neoplasm of cervix: Secondary | ICD-10-CM

## 2011-09-21 NOTE — Progress Notes (Addendum)
Subjective:    Angela Mitchell is a 46 y.o. female, [phone], who presents for an annual exam. The patient reports a miscarriage earlier this year and was on BCPs. She states that she as grieved this loss.  Recently diagnosed with migraine with aura, has stopped oral contraceptives and therefore considering other methods. Reviewed Paragard and Mirena IUDs, MOA, effectiveness, risks to include expulsion and perforation, side effects and duration of action.  Menstrual cycle:   LMP: Patient's last menstrual period was 09/05/2010.             Review of Systems Pertinent items are noted in HPI. Denies pelvic pain, urinary tract symptoms, vaginitis symptoms, irregular bleeding, menopausal symptoms, change in bowel habits or rectal bleeding   Objective:    BP 140/80  Pulse 82  Ht 5' 3.5" (1.613 m)  Wt 250 lb (113.399 kg)  BMI 43.59 kg/m2  LMP 09/05/2010  Breastfeeding? Unknown  Wt Readings from Last 1 Encounters:  09/21/11 250 lb (113.399 kg)   Body mass index is 43.59 kg/(m^2). General Appearance: Alert, no acute distress HEENT: Grossly normal Neck / Thyroid: Supple, no thyromegaly or cervical adenopathy Lungs: Clear to auscultation bilaterally Back: No CVA tenderness Breast Exam: No masses or nodes.No dimpling, nipple retraction or discharge. Cardiovascular: Regular rate and rhythm.  Gastrointestinal: Soft, non-tender, no masses or organomegaly Pelvic Exam: EGBUS-wnl, vagina-normal rugae, cervix- without lesions or tenderness, uterus appears normal size shape and consistency, exam limited by habitus,  adnexae-no masses or tenderness Rectovaginal: no masses and normal sphincter tone Lymphatic Exam: Non-palpable nodes in neck, clavicular,  axillary, or inguinal regions  Skin: no rashes or abnormalities Extremities: no clubbing cyanosis or edema  Neurologic: grossly normal Psychiatric: Alert and oriented  UPT-negative   Assessment:   Routine GYN Exam Need for  Contraception Dysmenorrhea (new) S/P SAB   Plan:  Handout on Methods f Contraception, IUD Information Form and Mirena IUD and patient would like to have the Mirena IUD Use condoms or abstinence until appointment for IUD insertion  PAP sent  RTO 1 year or prn  Jamell Opfer,ELMIRAPA-C

## 2011-09-21 NOTE — Progress Notes (Signed)
Regular Periods: no Mammogram: yes  Monthly Breast Ex.: no Exercise: no  Tetanus < 10 years: no Seatbelts: yes  NI. Bladder Functn.: yes Abuse at home: no  Daily BM's: yes Stressful Work: yes  Healthy Diet: yes Sigmoid-Colonoscopy: NO  Calcium: no Medical problems this year: CONSULT ABOUT IUD   LAST PAP2013  Contraception: NONE; PT HAD MISCARRIAGE IN 2/13  Mammogram:  2012   PCP: DR. Clint Lipps  PMH: NO CHANGE  FMH: NO CHANGE  Last Bone Scan: NO

## 2011-09-21 NOTE — Patient Instructions (Signed)
Take Citracal or Caltrate Calcium/Magnesium/Vitamin D twice daily

## 2011-09-23 LAB — PAP IG, CT-NG, RFX HPV ASCU
Chlamydia Probe Amp: NEGATIVE
GC Probe Amp: NEGATIVE

## 2011-10-06 ENCOUNTER — Telehealth: Payer: Self-pay | Admitting: Obstetrics and Gynecology

## 2011-10-06 NOTE — Telephone Encounter (Signed)
Lm on vm tcb rgd msg 

## 2011-10-06 NOTE — Telephone Encounter (Signed)
Spoke with pt rgd msg pt states schd for iud insertion tomorrow but had unprotected intercourse on Monday advise pt need to wait 2 weeks of no intercourse pt has appt 10/25/11 3:30 for u/s and 4:00 iud insertion

## 2011-10-07 ENCOUNTER — Other Ambulatory Visit: Payer: BC Managed Care – PPO

## 2011-10-07 ENCOUNTER — Encounter: Payer: BC Managed Care – PPO | Admitting: Obstetrics and Gynecology

## 2011-10-22 ENCOUNTER — Other Ambulatory Visit: Payer: Self-pay | Admitting: Internal Medicine

## 2011-10-22 DIAGNOSIS — R911 Solitary pulmonary nodule: Secondary | ICD-10-CM

## 2011-10-25 ENCOUNTER — Ambulatory Visit (INDEPENDENT_AMBULATORY_CARE_PROVIDER_SITE_OTHER): Payer: BC Managed Care – PPO | Admitting: Obstetrics and Gynecology

## 2011-10-25 ENCOUNTER — Ambulatory Visit (INDEPENDENT_AMBULATORY_CARE_PROVIDER_SITE_OTHER): Payer: BC Managed Care – PPO

## 2011-10-25 ENCOUNTER — Encounter: Payer: Self-pay | Admitting: Obstetrics and Gynecology

## 2011-10-25 VITALS — BP 118/72 | Wt 245.0 lb

## 2011-10-25 DIAGNOSIS — N949 Unspecified condition associated with female genital organs and menstrual cycle: Secondary | ICD-10-CM

## 2011-10-25 DIAGNOSIS — IMO0001 Reserved for inherently not codable concepts without codable children: Secondary | ICD-10-CM

## 2011-10-25 DIAGNOSIS — R102 Pelvic and perineal pain: Secondary | ICD-10-CM

## 2011-10-25 DIAGNOSIS — Z30430 Encounter for insertion of intrauterine contraceptive device: Secondary | ICD-10-CM

## 2011-10-25 DIAGNOSIS — Z975 Presence of (intrauterine) contraceptive device: Secondary | ICD-10-CM

## 2011-10-25 MED ORDER — LEVONORGESTREL 20 MCG/24HR IU IUD
INTRAUTERINE_SYSTEM | Freq: Once | INTRAUTERINE | Status: AC
  Administered 2011-10-25: 1 via INTRAUTERINE

## 2011-10-25 NOTE — Addendum Note (Signed)
Addended byWinfred Leeds on: 10/25/2011 05:04 PM   Modules accepted: Orders

## 2011-10-25 NOTE — Patient Instructions (Addendum)
Keep follow up appointment in 4 weeks  Call [facility] [phone]:  -for temperature of 100.4 degrees Fahrenheit or more -pain not improved with over the counter pain medications (Ibuprofen, Advil, Aleve,        Tylenol or acetaminophen) -for excessive bleeding (more than a usual period) -for any other concerns  Do not place anything in your vagina for the next 7 days  May take Ibuprofen 200 mg 3 tablets every 6 hours with food as needed for pain

## 2011-10-25 NOTE — Progress Notes (Signed)
IUD INSERTION NOTE  Angela Mitchell is a 46 y.o. female [phone] who presents for IUD insertion. Patient also had an ultrasound to evaluate a history of pelvic pain that was described as  "grand mal" spasms  O:  U/S-uterus- 8.23 x 5.44 x 4.60 cm (retroverted) with normal appearing ovaries/adnexae and no fluid in the cul-de-sac  Consent signed after risks and benefits were reviewed including but not limited to bleeding, infection, expulsion and risk of uterine perforation that may require an additional procedure for removal.  LMP: Patient's last menstrual period was 10/04/2010. UPT: negative  Mirena Lot # [id number]  Uterus assessed for size and position Prepped with Betadine Tenaculum placed on anterior lip of cervix after Hurricane gel was applied Uterus sounded at  8 cm Insertion of MIRENA IUD per protocol without any complications Strings trimmed   Assessment:  IUD Insertion                        H/O Pelvic Pain   Plan:  1. Patient instructed to call with oral temperature of 100.4 degrees Fahrenheit or more, excessive bleeding or pain that is not relieved with OTC analgesia taken as directed  2. Patient instructed on how  to check IUD strings and encouraged to do so after each menstrual cycle  3. Advised not to place anything in vagina or have sexual intercourse for 7 days  4. Follow-up: 4  weeks   5. Reviewed causes of pelvic pain: urogenital, previous surgery, gastrointestinal and musculoskeletal.  6. Follow up with PCP for non-GYN causes of pelvic pain.    Angela Ulatowski PA-C 10/25/2011 4:23 PM

## 2011-10-28 ENCOUNTER — Ambulatory Visit
Admission: RE | Admit: 2011-10-28 | Discharge: 2011-10-28 | Disposition: A | Discharge status: Home / Self Care | Payer: BC Managed Care – PPO | Source: Ambulatory Visit | Attending: Internal Medicine | Admitting: Internal Medicine

## 2011-10-28 DIAGNOSIS — R911 Solitary pulmonary nodule: Secondary | ICD-10-CM

## 2011-11-22 ENCOUNTER — Encounter: Payer: BC Managed Care – PPO | Admitting: Obstetrics and Gynecology

## 2011-11-23 ENCOUNTER — Encounter: Payer: Self-pay | Admitting: Obstetrics and Gynecology

## 2011-11-23 ENCOUNTER — Ambulatory Visit (INDEPENDENT_AMBULATORY_CARE_PROVIDER_SITE_OTHER): Payer: BC Managed Care – PPO | Admitting: Obstetrics and Gynecology

## 2011-11-23 VITALS — BP 118/72 | HR 74 | Wt 250.0 lb

## 2011-11-23 DIAGNOSIS — Z30431 Encounter for routine checking of intrauterine contraceptive device: Secondary | ICD-10-CM

## 2011-11-23 NOTE — Progress Notes (Signed)
46 YO with Mirena IUD insertion 10/25/11 for follow up. States no bleeding since the first 2 weeks (pad change twice a day).  Had a period 11/02/11 with bleeding/spotting x 3 weeks with use of 5 pads on heaviest days during period.  Cramps 7/10 were relieved with Ibuprofen.   O: Abdomen: soft, non-tender       Pelvic: EGBUS-wnl, vagina-normal, cervix-string visible, uterus-normal, adnexae-no tenderness or masses  A: IUD Follow up  P: RTO-as scheduled or prn  Leili Eskenazi, PA-C

## 2012-02-10 ENCOUNTER — Other Ambulatory Visit: Payer: Self-pay | Admitting: Pain Medicine

## 2012-02-10 DIAGNOSIS — M542 Cervicalgia: Secondary | ICD-10-CM

## 2012-02-17 ENCOUNTER — Ambulatory Visit
Admission: RE | Admit: 2012-02-17 | Discharge: 2012-02-17 | Disposition: A | Discharge status: Home / Self Care | Payer: BC Managed Care – PPO | Source: Ambulatory Visit | Attending: Pain Medicine | Admitting: Pain Medicine

## 2012-02-17 DIAGNOSIS — M542 Cervicalgia: Secondary | ICD-10-CM

## 2013-12-31 ENCOUNTER — Encounter: Payer: Self-pay | Admitting: Obstetrics and Gynecology

## 2014-01-15 ENCOUNTER — Other Ambulatory Visit (HOSPITAL_COMMUNITY): Payer: Self-pay | Admitting: Obstetrics

## 2014-01-15 ENCOUNTER — Other Ambulatory Visit (HOSPITAL_BASED_OUTPATIENT_CLINIC_OR_DEPARTMENT_OTHER): Payer: Self-pay | Admitting: Obstetrics

## 2014-01-15 DIAGNOSIS — Z1231 Encounter for screening mammogram for malignant neoplasm of breast: Secondary | ICD-10-CM

## 2014-01-25 ENCOUNTER — Ambulatory Visit (HOSPITAL_COMMUNITY): Payer: BC Managed Care – PPO

## 2014-01-28 LAB — LAB REPORT - SCANNED: PAP SMEAR: NEGATIVE

## 2014-01-31 ENCOUNTER — Ambulatory Visit (HOSPITAL_COMMUNITY)
Admission: RE | Admit: 2014-01-31 | Discharge: 2014-01-31 | Disposition: A | Discharge status: Home / Self Care | Payer: 59 | Source: Ambulatory Visit | Attending: Obstetrics | Admitting: Obstetrics

## 2014-01-31 DIAGNOSIS — Z1231 Encounter for screening mammogram for malignant neoplasm of breast: Secondary | ICD-10-CM | POA: Diagnosis present

## 2015-10-25 ENCOUNTER — Encounter: Payer: Self-pay | Admitting: *Deleted

## 2016-03-10 DIAGNOSIS — J329 Chronic sinusitis, unspecified: Secondary | ICD-10-CM | POA: Diagnosis not present

## 2016-06-16 DIAGNOSIS — E1165 Type 2 diabetes mellitus with hyperglycemia: Secondary | ICD-10-CM | POA: Diagnosis not present

## 2016-06-16 DIAGNOSIS — E782 Mixed hyperlipidemia: Secondary | ICD-10-CM | POA: Diagnosis not present

## 2016-06-22 DIAGNOSIS — E782 Mixed hyperlipidemia: Secondary | ICD-10-CM | POA: Diagnosis not present

## 2016-06-22 DIAGNOSIS — M150 Primary generalized (osteo)arthritis: Secondary | ICD-10-CM | POA: Diagnosis not present

## 2016-06-22 DIAGNOSIS — G43109 Migraine with aura, not intractable, without status migrainosus: Secondary | ICD-10-CM | POA: Diagnosis not present

## 2016-06-22 DIAGNOSIS — Z0000 Encounter for general adult medical examination without abnormal findings: Secondary | ICD-10-CM | POA: Diagnosis not present

## 2016-07-05 DIAGNOSIS — Z01419 Encounter for gynecological examination (general) (routine) without abnormal findings: Secondary | ICD-10-CM | POA: Diagnosis not present

## 2016-11-17 DIAGNOSIS — R1013 Epigastric pain: Secondary | ICD-10-CM | POA: Diagnosis not present

## 2016-11-17 DIAGNOSIS — R112 Nausea with vomiting, unspecified: Secondary | ICD-10-CM | POA: Diagnosis not present

## 2016-11-17 DIAGNOSIS — Z23 Encounter for immunization: Secondary | ICD-10-CM | POA: Diagnosis not present

## 2016-11-17 DIAGNOSIS — R197 Diarrhea, unspecified: Secondary | ICD-10-CM | POA: Diagnosis not present

## 2016-11-18 ENCOUNTER — Other Ambulatory Visit: Payer: Self-pay | Admitting: Internal Medicine

## 2016-11-18 DIAGNOSIS — R1013 Epigastric pain: Secondary | ICD-10-CM

## 2016-12-09 DIAGNOSIS — E1165 Type 2 diabetes mellitus with hyperglycemia: Secondary | ICD-10-CM | POA: Diagnosis not present

## 2016-12-09 DIAGNOSIS — E782 Mixed hyperlipidemia: Secondary | ICD-10-CM | POA: Diagnosis not present

## 2016-12-09 DIAGNOSIS — E1142 Type 2 diabetes mellitus with diabetic polyneuropathy: Secondary | ICD-10-CM | POA: Diagnosis not present

## 2016-12-15 ENCOUNTER — Encounter: Payer: Self-pay | Admitting: Gastroenterology

## 2016-12-21 ENCOUNTER — Ambulatory Visit
Admission: RE | Admit: 2016-12-21 | Discharge: 2016-12-21 | Disposition: A | Discharge status: Home / Self Care | Payer: 59 | Source: Ambulatory Visit | Attending: Internal Medicine | Admitting: Internal Medicine

## 2016-12-21 DIAGNOSIS — K8020 Calculus of gallbladder without cholecystitis without obstruction: Secondary | ICD-10-CM | POA: Diagnosis not present

## 2016-12-21 DIAGNOSIS — R1013 Epigastric pain: Secondary | ICD-10-CM

## 2016-12-23 DIAGNOSIS — Z1231 Encounter for screening mammogram for malignant neoplasm of breast: Secondary | ICD-10-CM | POA: Diagnosis not present

## 2017-02-09 ENCOUNTER — Ambulatory Visit: Payer: BC Managed Care – PPO | Admitting: Gastroenterology

## 2017-03-17 DIAGNOSIS — Z1211 Encounter for screening for malignant neoplasm of colon: Secondary | ICD-10-CM | POA: Diagnosis not present

## 2017-03-17 DIAGNOSIS — K219 Gastro-esophageal reflux disease without esophagitis: Secondary | ICD-10-CM | POA: Diagnosis not present

## 2017-04-07 DIAGNOSIS — J019 Acute sinusitis, unspecified: Secondary | ICD-10-CM | POA: Diagnosis not present

## 2017-04-07 DIAGNOSIS — R05 Cough: Secondary | ICD-10-CM | POA: Diagnosis not present

## 2017-04-29 DIAGNOSIS — Z1211 Encounter for screening for malignant neoplasm of colon: Secondary | ICD-10-CM | POA: Diagnosis not present

## 2017-05-26 DIAGNOSIS — R002 Palpitations: Secondary | ICD-10-CM | POA: Diagnosis not present

## 2017-05-27 DIAGNOSIS — E119 Type 2 diabetes mellitus without complications: Secondary | ICD-10-CM | POA: Diagnosis not present

## 2017-05-29 DIAGNOSIS — J301 Allergic rhinitis due to pollen: Secondary | ICD-10-CM | POA: Diagnosis not present

## 2017-06-13 DIAGNOSIS — I10 Essential (primary) hypertension: Secondary | ICD-10-CM | POA: Diagnosis not present

## 2017-06-13 DIAGNOSIS — R002 Palpitations: Secondary | ICD-10-CM | POA: Diagnosis not present

## 2017-06-13 DIAGNOSIS — E119 Type 2 diabetes mellitus without complications: Secondary | ICD-10-CM | POA: Diagnosis not present

## 2017-06-24 DIAGNOSIS — R002 Palpitations: Secondary | ICD-10-CM | POA: Diagnosis not present

## 2017-06-24 DIAGNOSIS — I10 Essential (primary) hypertension: Secondary | ICD-10-CM | POA: Diagnosis not present

## 2017-06-24 DIAGNOSIS — R0789 Other chest pain: Secondary | ICD-10-CM | POA: Diagnosis not present

## 2017-07-08 DIAGNOSIS — I10 Essential (primary) hypertension: Secondary | ICD-10-CM | POA: Diagnosis not present

## 2017-07-08 DIAGNOSIS — R002 Palpitations: Secondary | ICD-10-CM | POA: Diagnosis not present

## 2017-07-08 DIAGNOSIS — R0789 Other chest pain: Secondary | ICD-10-CM | POA: Diagnosis not present

## 2017-07-13 DIAGNOSIS — E1142 Type 2 diabetes mellitus with diabetic polyneuropathy: Secondary | ICD-10-CM | POA: Diagnosis not present

## 2017-07-21 DIAGNOSIS — I10 Essential (primary) hypertension: Secondary | ICD-10-CM | POA: Diagnosis not present

## 2017-07-21 DIAGNOSIS — R0789 Other chest pain: Secondary | ICD-10-CM | POA: Diagnosis not present

## 2017-07-21 DIAGNOSIS — R002 Palpitations: Secondary | ICD-10-CM | POA: Diagnosis not present

## 2017-08-01 ENCOUNTER — Encounter: Payer: Self-pay | Admitting: Neurology

## 2017-08-04 ENCOUNTER — Ambulatory Visit (INDEPENDENT_AMBULATORY_CARE_PROVIDER_SITE_OTHER): Payer: 59 | Admitting: Neurology

## 2017-08-04 ENCOUNTER — Encounter: Payer: Self-pay | Admitting: Neurology

## 2017-08-04 VITALS — BP 105/73 | HR 81 | Ht 63.0 in | Wt 226.0 lb

## 2017-08-04 DIAGNOSIS — R42 Dizziness and giddiness: Secondary | ICD-10-CM

## 2017-08-04 DIAGNOSIS — E1143 Type 2 diabetes mellitus with diabetic autonomic (poly)neuropathy: Secondary | ICD-10-CM

## 2017-08-04 DIAGNOSIS — R002 Palpitations: Secondary | ICD-10-CM | POA: Diagnosis not present

## 2017-08-04 DIAGNOSIS — E66811 Obesity, class 1: Secondary | ICD-10-CM

## 2017-08-04 DIAGNOSIS — F5102 Adjustment insomnia: Secondary | ICD-10-CM

## 2017-08-04 DIAGNOSIS — R0683 Snoring: Secondary | ICD-10-CM | POA: Diagnosis not present

## 2017-08-04 DIAGNOSIS — E669 Obesity, unspecified: Secondary | ICD-10-CM

## 2017-08-04 DIAGNOSIS — S91332A Puncture wound without foreign body, left foot, initial encounter: Secondary | ICD-10-CM | POA: Diagnosis not present

## 2017-08-04 NOTE — Progress Notes (Signed)
SLEEP MEDICINE CLINIC   Provider:  Melvyn Novas, [place]  Primary Care Physician:  Lewis Moccasin, MD     Chief Complaint  Patient presents with  . New Patient (Initial Visit)    pt alone, rm 10. pt has been told she snores her cardiologist  sent her here. sleep study 20+ years ago, she was ordered a CPAP and she was unable to tolerate at that time. pt has heart paplitations.     HPI:  Angela Mitchell is a 52 y.o. female patient , seen here in a referral from Dr. Maryelizabeth Rowan and Dr. Rosemary Holms of [facility] cardiovascular .  I have the pleasure of meeting with [name] today who is a new patient to our practice.  She is seen on 04 August 2017 upon referral by her cardiologist she also just recently established herself with a new primary care physician, Dr. Maryelizabeth Rowan. At [facility] cardiovascular she was also seen by PA Arna Medici, who remarked on her history of elevated body mass index, diabetes, hypotension, GERD,  and high suspicion for obstructive sleep apnea to be present.   The patient also has retrognathia.  She had presented with feeling unwell and nearly fainting but not losing awareness, being dizzy , drifting to the left- about 3 times in this calendar year.   Feeling flushed, drenched in sweat. Felt lightheaded, nauseated,- she also felt palpitations. 95/50 mmHg BP and Blood glucose was 100,She drank some pickle brine and felt her BP improved. An E[facility] at [facility] had been read as normal, she did not have any elevated cardiac enzymes.  She is currently on Invokana, atorvastatin, nortriptyline, pantoprazole, ranitidine,Tradjenta, and she takes prn Tylenol,  Excedrin Migraine. She has an IUD.    Chief complaint according to patient : she is fatigued, and sleepy- and recently Spells of vertigo, near syncope. Here to see if OSA is present, husband reports snoring.  Sleep habits are as follows: Her bedtime varies, no routine- no ritual. Latest 10 o' clock.   On Friday or Saturday night she will go to [employer] to get a cup of coffee and then goes to sleep. She sleeps with TV or Video on the labtop in her bedroom, she listens to bible audio books. She looks at the clock , and she is anxious. She is never at rest, neither her mind.  She wears headphones.  She sleeps on her side, on one pillow for head support.  She has noticed that she can go to the bathroom 2-3 times but only went one time when she took melatonin.  Her alarm is set for 6 AM she does use the snooze button a couple of times usually rises by around 630.  She feels not rested or refreshed in the morning.  She sleeps on average 5 to 6 hours a night.  She gets up at the same time on Saturday and Sunday. Husband uses CPAP.   Sleep medical history and family sleep history: no family history of OSA.  Patient has no ENT surgery, wisdom teeth removed. 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    Social history: lives with husband and brother in law, children 53 year old daughter and 42 year old, grandsons 71 and 11 years old. Non smoker, non ETOH, caffeinated drinks, 3-5 a week day.  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   Took nortriptyline in AM - now in PM.  Review of Systems: Out of a complete 14 system review, the patient complains of only the following symptoms, and all other reviewed systems are negative.  snoring, sleep talking, yelling, bruxism, always tired.   Epworth score  1 , Fatigue severity score 21  , depression score    Social History   Socioeconomic History  . Marital status: Married    Spouse name: Not on file  . Number of children: Not on file  . Years of education: Not on file  . Highest education level: Not on file  Occupational History  . Not on file  Social Needs  . Financial resource strain: Not on file  . Food insecurity:    Worry: Not on file    Inability: Not on file  . Transportation needs:    Medical: Not on file      Non-medical: Not on file  Tobacco Use  . Smoking status: Never Smoker  . Smokeless tobacco: Never Used  Substance and Sexual Activity  . Alcohol use: No  . Drug use: No  . Sexual activity: Yes    Birth control/protection: IUD    Comment: MIRENA IUD  Lifestyle  . Physical activity:    Days per week: Not on file    Minutes per session: Not on file  . Stress: Not on file  Relationships  . Social connections:    Talks on phone: Not on file    Gets together: Not on file    Attends religious service: Not on file    Active member of club or organization: Not on file    Attends meetings of clubs or organizations: Not on file    Relationship status: Not on file  . Intimate partner violence:    Fear of current or ex partner: Not on file    Emotionally abused: Not on file    Physically abused: Not on file    Forced sexual activity: Not on file  Other Topics Concern  . Not on file  Social History Narrative  . Not on file    Family History  Problem Relation Age of Onset  . Diabetes Mother   . Hypertension Father   . Hyperlipidemia Father   . Diabetes Father   . Diabetes Maternal Grandmother     Past Medical History:  Diagnosis Date  . Acid reflux   . Anxiety   . Bronchitis   . Chlamydia   . Diabetes mellitus   . Gonorrhea   . H/O sickle cell trait   . Herpes   . Hypercholesteremia   . Migraines 2013   with aura  . Yeast infection     Past Surgical History:  Procedure Laterality Date  . CESAREAN SECTION    . DILATION AND CURETTAGE OF UTERUS    . WISDOM TOOTH EXTRACTION      Current Outpatient Medications  Medication Sig Dispense Refill  . amoxicillin-clavulanate (AUGMENTIN) 875-125 MG tablet amoxicillin 875 mg-potassium clavulanate 125 mg tablet    . atorvastatin (LIPITOR) 20 MG tablet Take 20 mg by mouth daily.    . Canagliflozin-metFORMIN HCl (INVOKAMET) [id number] MG TABS Take 1 tablet by mouth 2 (two) times daily.    Marland Kitchen. escitalopram (LEXAPRO) 5 MG tablet  Take 5 mg by mouth daily.  0  . irbesartan (AVAPRO) 150 MG tablet Take 150 mg by mouth daily.    Marland Kitchen. linagliptin (TRADJENTA) 5 MG TABS tablet Take 5 mg by mouth daily.    . pantoprazole (PROTONIX) 40 MG tablet Take  40 mg by mouth daily.    . ranitidine (ZANTAC) 150 MG tablet Take 150 mg by mouth 2 (two) times daily.     No current facility-administered medications for this visit.     Allergies as of 08/04/2017  . (No Known Allergies)    Vitals: BP 105/73   Pulse 81   Ht 5\' 3"  (1.6 m)   Wt 226 lb (102.5 kg)   BMI 40.03 kg/m  Last Weight:  Wt Readings from Last 1 Encounters:  08/04/17 226 lb (102.5 kg)   ZOX:WRUE mass index is 40.03 kg/m.     Last Height:   Ht Readings from Last 1 Encounters:  08/04/17 5\' 3"  (1.6 m)    Physical exam:  General: The patient is awake, alert and appears not in acute distress. The patient is well groomed. Head: Normocephalic, atraumatic. Neck is supple. Mallampati 4  neck circumference:17 . Nasal airflow patent , TMJ  Click evident . Retrognathia is seen.  Cardiovascular:  Regular rate and rhythm , without  murmurs or carotid bruit, and without distended neck veins. Respiratory: Lungs are clear to auscultation. Skin:  Without evidence of edema, or rash, left foot injury- stabbed small toe with a tooth pick.  Trunk: BMI is 40 Neurologic exam : The patient is awake and alert, oriented to place and time.   Memory subjective described as intact.  Attention span & concentration ability appears limited,  Speech is fluent, non stop -  without dysarthria, dysphonia or aphasia.  Mood and affect are appropriate.  Cranial nerves: Pupils are equal and briskly reactive to light. Funduscopic exam without evidence of pallor or edema. Extraocular movements  in vertical and horizontal planes intact and without nystagmus. Visual fields by finger perimetry are intact. Hearing to finger rub intact. Facial sensation intact to fine touch Facial motor strength is  symmetric and tongue and uvula move midline. Shoulder shrug was symmetrical.   Motor exam: Normal tone, muscle bulk and symmetric strength in all extremities. Sensory:  Fine touch, pinprick and vibration were tested in all extremities. Proprioception tested in the upper extremities was normal. Coordination:  Finger-to-nose maneuver normal without evidence of ataxia, dysmetria or tremor. Gait and station: Patient walks without assistive device  Turns with  3 Steps.  Deep tendon reflexes: in the  upper and lower extremities are symmetric and intact. Babinski maneuver response is  downgoing.  Assessment:  After physical and neurologic examination, review of laboratory studies,  Personal review of imaging studies, reports of other /same  Imaging studies, results of polysomnography and / or neurophysiology testing and pre-existing records as far as provided in visit., my assessment is   1) the patient reports that she is excessively daytime tired but not necessarily sleepy also she does keep her days very busy, she has multiple responsibilities and she feels that she does not really rest or put her mind to rest at all.  There is certainly some sleep routines and habits but could be improving such as late caffeine intake, light and sound exposure.  She likes her bathroom and bedroom cool which is a benefit for sustain sleep.  She does feel that her husband's air leak on the CPAP creates a background sound that is hard for her to sleep with.  This would be to be addressed with his sleep physician.  I gave patient a 14-day [employer] for insomnia I do think that if her husband's observations are true and she snores and may have apnea she has  multiple risk factors including a BMI over 30, and neck circumference that is larger than average, high-grade Mallampati, and significant retrognathia.  Comorbidities such as variable blood pressures, diabetes, nocturia, may all benefit from apnea control and treatment if  such is found.   The patient was advised of the nature of the diagnosed disorder , the treatment options and the  risks for general health and wellness arising from not treating the condition.   I spent more than 45 minutes of face to face time with the patient.  Greater than 50% of time was spent in counseling and coordination of care. We have discussed the diagnosis and differential and I answered the patient's questions.    Plan:  Treatment plan and additional workup :  Melatonin - 5 mg or less-  HST  14 days boot camp.    Melvyn Novas, MD 08/04/2017, 1:14 PM  Certified in Neurology by ABPN Certified in Sleep Medicine by [facility] [address] [place], [place] [address]

## 2017-08-10 ENCOUNTER — Telehealth: Payer: Self-pay | Admitting: Neurology

## 2017-08-10 NOTE — Telephone Encounter (Signed)
Pt called stating she didn't see instruction for when to returning for a f/u. Please call to advise when to f/u

## 2017-08-11 NOTE — Telephone Encounter (Signed)
Called the patient and informed her that based off of the last visit she was going to have a HST completed. Once the sleep test is completed and she has read the results I will contact her and review those results and based off what the sleep test shows will depend on how she would follow up. Pt verbalized understanding. Pt had no questions at this time but was encouraged to call back if questions arise.

## 2017-08-15 NOTE — Telephone Encounter (Signed)
Pt has returned the call [facility], she is asking for a call back

## 2017-08-16 NOTE — Telephone Encounter (Signed)
Pt returning my 2 left messages to get scheduled for HST.  Pt to pick up HST on July 15 at @ 2:30pm

## 2017-08-30 DIAGNOSIS — E782 Mixed hyperlipidemia: Secondary | ICD-10-CM | POA: Diagnosis not present

## 2017-08-30 DIAGNOSIS — E1142 Type 2 diabetes mellitus with diabetic polyneuropathy: Secondary | ICD-10-CM | POA: Diagnosis not present

## 2017-09-05 DIAGNOSIS — G4733 Obstructive sleep apnea (adult) (pediatric): Secondary | ICD-10-CM | POA: Diagnosis not present

## 2017-09-05 DIAGNOSIS — E782 Mixed hyperlipidemia: Secondary | ICD-10-CM | POA: Diagnosis not present

## 2017-09-05 DIAGNOSIS — E1142 Type 2 diabetes mellitus with diabetic polyneuropathy: Secondary | ICD-10-CM | POA: Diagnosis not present

## 2017-09-12 ENCOUNTER — Ambulatory Visit (INDEPENDENT_AMBULATORY_CARE_PROVIDER_SITE_OTHER): Payer: 59 | Admitting: Neurology

## 2017-09-12 DIAGNOSIS — E669 Obesity, unspecified: Secondary | ICD-10-CM

## 2017-09-12 DIAGNOSIS — R0683 Snoring: Secondary | ICD-10-CM

## 2017-09-12 DIAGNOSIS — R42 Dizziness and giddiness: Secondary | ICD-10-CM

## 2017-09-12 DIAGNOSIS — G4733 Obstructive sleep apnea (adult) (pediatric): Secondary | ICD-10-CM | POA: Diagnosis not present

## 2017-09-12 DIAGNOSIS — R002 Palpitations: Secondary | ICD-10-CM

## 2017-09-12 DIAGNOSIS — E1143 Type 2 diabetes mellitus with diabetic autonomic (poly)neuropathy: Secondary | ICD-10-CM

## 2017-09-12 DIAGNOSIS — F5102 Adjustment insomnia: Secondary | ICD-10-CM

## 2017-09-22 DIAGNOSIS — E669 Obesity, unspecified: Secondary | ICD-10-CM | POA: Insufficient documentation

## 2017-09-22 DIAGNOSIS — R0683 Snoring: Secondary | ICD-10-CM | POA: Insufficient documentation

## 2017-09-22 DIAGNOSIS — F5102 Adjustment insomnia: Secondary | ICD-10-CM | POA: Insufficient documentation

## 2017-09-22 DIAGNOSIS — E1143 Type 2 diabetes mellitus with diabetic autonomic (poly)neuropathy: Secondary | ICD-10-CM | POA: Insufficient documentation

## 2017-09-22 NOTE — Procedures (Signed)
[facility] [address] [place], [place] [address] NAME:  Angela CarrowYolanda Ptacek                                                                      DOB: 11/20/1965 MEDICAL RECORD No:   [id number]                                                DOS:  09/19/2017 REFERRING PHYSICIAN: Maryelizabeth Rowanewey, Elizabeth, MD  STUDY PERFORMED: Home Sleep Study on watch pat,  HISTORY: [name] is a 52 y.o. female patient, seen here in a referral from Dr. Maryelizabeth RowanElizabeth Dewey and Dr. Rosemary [facility]msPatwardhan of [facility].  She was seen on 04 August 2017 upon referral by her cardiologist, who remarked on her history of elevated body mass index, diabetes, hypotension, GERD, and his high suspicion for obstructive sleep apnea to be present. The patient also has retrognathia.   She had presented to cardiology after nearly fainting but not losing awareness, being dizzy, unsteady- these symptoms happened about 3 times in this calendar year.  She endorsed feeling flushed, drenched in sweat, lightheaded, nauseated, felt palpitations.  Chief complaint according to patient: she is fatigued, and not sleepy- and had recently spells of vertigo, hypotension, near syncope. Here to see if OSA is present, husband reports her to be snoring. BMI: 40.2. Epworth Sleepiness score endorsed at one point. FSS at 21 points.   STUDY RESULTS:  Total Recording Time: 8 hours 32 minutes, valid test time: 6 h 33 minutes. Total Apnea/Hypopnea Index (AHI):  26.6/h: RDI:  27.1 /h Average Oxygen Saturation:  94 %; Lowest Oxygen Saturation: 81 %  Total Time Oxygen in Saturation below 89 %: 1.8 minutes  Average Heart Rate:  80 bpm (between 53 and 111 bpm) IMPRESSION: Moderate Sleep Apnea, Obstructive Type, AHI 26.6. Moderate snoring. No clinically significant hypoxemia, nor tachy-bradycardia noted. RECOMMENDATION: CPAP is one treatment option, this type of apnea may also respond well to a dental device. Weight loss is strongly  recommended.  If the patient agrees, I will order an auto-titration CPAP with a pressure range form 5-18 cm water with 3 cm EOR and heated humidity. Mask to be fitted in reclined position.   I certify that I have reviewed the raw data recording prior to the issuance of this report in accordance with the standards of the American Academy of Sleep Medicine (AASM). Melvyn Novasarmen Raegyn Renda, M.D.      09-22-2017    Medical Director of [facility], accredited by the AASM. Diplomat of the ABPN and ABSM.

## 2017-09-22 NOTE — Addendum Note (Signed)
Addended by: Melvyn NovasHMEIER, Biviana Saddler on: 09/22/2017 05:29 PM   Modules accepted: Orders

## 2017-09-26 ENCOUNTER — Telehealth: Payer: Self-pay | Admitting: Neurology

## 2017-09-26 NOTE — Telephone Encounter (Signed)
-----   Message from Melvyn Novasarmen Dohmeier, MD sent at 09/22/2017  5:29 PM EDT ----- IMPRESSION: Moderate Sleep Apnea, Obstructive Type, AHI 26.6. Moderate snoring. No clinically significant hypoxemia, nor tachy-bradycardia noted. RECOMMENDATION: CPAP is one treatment option, this type of apnea  may also respond well to a dental device. Weight loss is strongly  recommended.  If the patient agrees, I will order an auto-titration CPAP with a  pressure range form 5-18 cm water with 3 cm EOR and heated  humidity. Mask to be fitted in reclined position.  Please explain CPAP compliance as defined by 4 hours or more of daily CPAP use.    Cc Dr Duanne Guessewey, please.

## 2017-09-26 NOTE — Telephone Encounter (Signed)
Called patient to discuss sleep study results. No answer at this time. LVM for the patient to call back.   

## 2017-10-05 NOTE — Telephone Encounter (Signed)
I called Angela Mitchell. I advised Angela Mitchell that Dr. Vickey Hugerohmeier reviewed their sleep study results and found that Angela Mitchell has moderate sleep apnea. Dr. Vickey Hugerohmeier recommends that the Angela Mitchell treat apnea with either an auto CPAP or dental device. I reviewed both treatment options with the patient and also encouraged weight loss to help as well. Informed her of the steps I would take depending on which treatment option. Patient thanked me for reviewing the information but didn't state at this time which route she would like to choose. I instructed her to call once she has decided what treatment choice to take. Angela Mitchell verbalized understanding of results. Angela Mitchell had no questions at this time but was encouraged to call back if questions arise.

## 2017-11-30 DIAGNOSIS — Z23 Encounter for immunization: Secondary | ICD-10-CM | POA: Diagnosis not present

## 2017-11-30 DIAGNOSIS — H579 Unspecified disorder of eye and adnexa: Secondary | ICD-10-CM | POA: Diagnosis not present

## 2017-11-30 DIAGNOSIS — L739 Follicular disorder, unspecified: Secondary | ICD-10-CM | POA: Diagnosis not present

## 2017-11-30 DIAGNOSIS — R202 Paresthesia of skin: Secondary | ICD-10-CM | POA: Diagnosis not present

## 2017-12-16 DIAGNOSIS — R739 Hyperglycemia, unspecified: Secondary | ICD-10-CM | POA: Diagnosis not present

## 2017-12-16 DIAGNOSIS — Z0000 Encounter for general adult medical examination without abnormal findings: Secondary | ICD-10-CM | POA: Diagnosis not present

## 2017-12-19 DIAGNOSIS — E1142 Type 2 diabetes mellitus with diabetic polyneuropathy: Secondary | ICD-10-CM | POA: Diagnosis not present

## 2017-12-19 DIAGNOSIS — Z6841 Body Mass Index (BMI) 40.0 and over, adult: Secondary | ICD-10-CM | POA: Diagnosis not present

## 2017-12-19 DIAGNOSIS — Z0000 Encounter for general adult medical examination without abnormal findings: Secondary | ICD-10-CM | POA: Diagnosis not present

## 2017-12-19 DIAGNOSIS — E782 Mixed hyperlipidemia: Secondary | ICD-10-CM | POA: Diagnosis not present

## 2017-12-19 DIAGNOSIS — G4733 Obstructive sleep apnea (adult) (pediatric): Secondary | ICD-10-CM | POA: Diagnosis not present

## 2017-12-19 DIAGNOSIS — Z23 Encounter for immunization: Secondary | ICD-10-CM | POA: Diagnosis not present

## 2017-12-27 DIAGNOSIS — Z1231 Encounter for screening mammogram for malignant neoplasm of breast: Secondary | ICD-10-CM | POA: Diagnosis not present

## 2018-01-16 DIAGNOSIS — N951 Menopausal and female climacteric states: Secondary | ICD-10-CM | POA: Diagnosis not present

## 2018-01-16 DIAGNOSIS — I10 Essential (primary) hypertension: Secondary | ICD-10-CM | POA: Diagnosis not present

## 2018-01-16 DIAGNOSIS — L739 Follicular disorder, unspecified: Secondary | ICD-10-CM | POA: Diagnosis not present

## 2018-10-18 ENCOUNTER — Other Ambulatory Visit: Payer: Self-pay

## 2018-10-18 DIAGNOSIS — Z20822 Contact with and (suspected) exposure to covid-19: Secondary | ICD-10-CM

## 2018-10-20 LAB — NOVEL CORONAVIRUS, NAA: SARS-CoV-2, NAA: NOT DETECTED

## 2018-11-23 ENCOUNTER — Ambulatory Visit: Payer: Self-pay | Admitting: Cardiology

## 2018-11-27 ENCOUNTER — Other Ambulatory Visit: Payer: Self-pay

## 2018-11-27 ENCOUNTER — Encounter: Payer: Self-pay | Admitting: Cardiology

## 2018-11-27 ENCOUNTER — Ambulatory Visit (INDEPENDENT_AMBULATORY_CARE_PROVIDER_SITE_OTHER): Payer: 59 | Admitting: Cardiology

## 2018-11-27 VITALS — BP 117/54 | HR 58 | Temp 97.8°F | Ht 63.0 in | Wt 225.0 lb

## 2018-11-27 DIAGNOSIS — R079 Chest pain, unspecified: Secondary | ICD-10-CM | POA: Diagnosis not present

## 2018-11-27 DIAGNOSIS — R0600 Dyspnea, unspecified: Secondary | ICD-10-CM | POA: Insufficient documentation

## 2018-11-27 DIAGNOSIS — R0609 Other forms of dyspnea: Secondary | ICD-10-CM

## 2018-11-27 MED ORDER — ASPIRIN EC 81 MG PO TBEC: 81.0000 mg | DELAYED_RELEASE_TABLET | Freq: Every day | ORAL | Status: DC

## 2018-11-27 MED ORDER — NITROGLYCERIN 0.4 MG SL SUBL: 0.4000 mg | SUBLINGUAL_TABLET | SUBLINGUAL | 3 refills | Status: DC | PRN

## 2018-11-27 NOTE — Progress Notes (Signed)
Follow up visit  Subjective:   Angela Mitchell, female    DOB: 21-Jul-1965, 53 y.o.   MRN: [id number]   Chief Complaint  Patient presents with  . Chest Pain  . Shortness of Breath  . Follow-up    HPI  53 y/o [place] American female with hypertension, hyperlipidemia, type 2 DM, moderate obesity.  Patient was previously seen in 06/2017. Cardiac workup was unremarkable, details below. Her palpitations were elt to be due to occasional PAC/PVC. Patient is referred back to [place] by her PCP Dr. Duanne Guess due to complaints of shortness of breath.   Patient has several complaints today.  She has noted episodes of hypotension, as reportedly monitored by patient to be 70/60 mmHg.  His episodes occur once every 2 months or so.  She feels tired, fatigued, associated with nausea and vomiting.  Separately, she had an episode of chest pressure that persisted for up to 3 days, improved with exertion.  The symptoms have currently resolved.  Irbesartan was reportedly stopped by Dr. Duanne Guess.  She is currently on hydrochlorothiazide 25 mg daily.  Past Medical History:  Diagnosis Date  . Acid reflux   . Anxiety   . Bronchitis   . Chlamydia   . Diabetes mellitus   . Gonorrhea   . H/O sickle cell trait   . Herpes   . Hypercholesteremia   . Migraines 2013   with aura  . Yeast infection      Past Surgical History:  Procedure Laterality Date  . CESAREAN SECTION    . DILATION AND CURETTAGE OF UTERUS    . WISDOM TOOTH EXTRACTION       Social History   Socioeconomic History  . Marital status: Married    Spouse name: Not on file  . Number of children: Not on file  . Years of education: Not on file  . Highest education level: Not on file  Occupational History  . Not on file  Social Needs  . Financial resource strain: Not on file  . Food insecurity    Worry: Not on file    Inability: Not on file  . Transportation needs    Medical: Not on file    Non-medical: Not on file  Tobacco Use  .  Smoking status: Never Smoker  . Smokeless tobacco: Never Used  Substance and Sexual Activity  . Alcohol use: No  . Drug use: No  . Sexual activity: Yes    Birth control/protection: I.U.D.    Comment: MIRENA IUD  Lifestyle  . Physical activity    Days per week: Not on file    Minutes per session: Not on file  . Stress: Not on file  Relationships  . Social Musician on phone: Not on file    Gets together: Not on file    Attends religious service: Not on file    Active member of club or organization: Not on file    Attends meetings of clubs or organizations: Not on file    Relationship status: Not on file  . Intimate partner violence    Fear of current or ex partner: Not on file    Emotionally abused: Not on file    Physically abused: Not on file    Forced sexual activity: Not on file  Other Topics Concern  . Not on file  Social History Narrative  . Not on file     Family History  Problem Relation Age of Onset  . Diabetes  Mother   . Hypertension Father   . Hyperlipidemia Father   . Diabetes Father   . Diabetes Maternal Grandmother      Current Outpatient Medications on File Prior to Visit  Medication Sig Dispense Refill  . ADVAIR DISKUS 100-50 MCG/DOSE AEPB Inhale 1 puff into the lungs 2 (two) times daily as needed.    Marland Kitchen. atorvastatin (LIPITOR) 20 MG tablet Take 20 mg by mouth daily.    Marland Kitchen. azelastine (ASTELIN) 0.1 % nasal spray Place 2 sprays into both nostrils 2 (two) times daily.    Marland Kitchen. escitalopram (LEXAPRO) 5 MG tablet Take 3 tablets by mouth daily.    . fluconazole (DIFLUCAN) 150 MG tablet Take 1 tablet by mouth as needed.    . hydrochlorothiazide (HYDRODIURIL) 25 MG tablet Take 1 tablet by mouth daily.    Marland Kitchen. linagliptin (TRADJENTA) 5 MG TABS tablet Take 5 mg by mouth daily.    . meclizine (ANTIVERT) 25 MG tablet Take 1 tablet by mouth as needed.    Marland Kitchen. OZEMPIC, 0.25 OR 0.5 MG/DOSE, 2 MG/1.5ML SOPN Inject 0.5 Units as directed once a week.     No current  facility-administered medications on file prior to visit.     Cardiovascular studies:  EKG 11/27/2018: Sinus rhythm 59 bpm.  Low voltage in precordial leads.   Event monitor 06/13/2017 - 07/12/2017: Sinus rhythm/tachycardia with occasional PACs and PVCs.  Symptoms of skipped beats associated with PACs and PVCs.  No atrial fibrillation, atrial flutter, SVT, VT, high-grade AV block.  Echocardiogram 07/08/2017: Left ventricle cavity is normal in size. Mild concentric hypertrophy of the left ventricle. Normal global wall motion. Visual EF is 55-60%. Normal diastolic filling pattern. Calculated EF 58%. Mild tricuspid regurgitation. Estimated pulmonary artery systolic pressure 27  mmHg. No evidence of pulmonary hypertension.  Exercise sestamibi stress test 06/24/2017:  1. The patient performed treadmill exercise using a Bruce protocol, completing 6:32 minutes. The patient completed an estimated workload of 7.86 METS, reaching 92% of the maximum predicted heart rate. Stress symptoms included Dizziness and palpitations.. Normal hemodynamic response. No ischemic changes seen on stress electrocardiogram.  2. The overall quality of the study is good. There is no evidence of abnormal lung activity. Stress and rest SPECT images demonstrate homogeneous tracer distribution throughout the myocardium. Gated SPECT imaging reveals normal myocardial thickening and wall motion. The left ventricular ejection fraction was normal (66%).   3. Low risk study.  Recent labs: Not available.   Review of Systems  Constitution: Negative for decreased appetite, malaise/fatigue, weight gain and weight loss.  HENT: Negative for congestion.   Eyes: Negative for visual disturbance.  Cardiovascular: Positive for chest pain (Currently absent) and dyspnea on exertion (Currently absent). Negative for leg swelling, palpitations and syncope.  Respiratory: Negative for cough.   Endocrine: Negative for cold intolerance.   Hematologic/Lymphatic: Does not bruise/bleed easily.  Skin: Negative for itching and rash.  Musculoskeletal: Negative for myalgias.  Gastrointestinal: Positive for nausea (Currently absent). Negative for abdominal pain and vomiting.  Genitourinary: Negative for dysuria.  Neurological: Negative for dizziness and weakness.  Psychiatric/Behavioral: The patient is not nervous/anxious.   All other systems reviewed and are negative.       Vitals:   11/27/18 1604  BP: (!) 117/54  Pulse: (!) 58  Temp: 97.8 F (36.6 C)  SpO2: 99%    Body mass index is 39.86 kg/m. Filed Weights   11/27/18 1604  Weight: 225 lb (102.1 kg)    Objective:   Physical Exam  Constitutional: She is oriented to person, place, and time. She appears well-developed and well-nourished. No distress.  HENT:  Head: Normocephalic and atraumatic.  Eyes: Pupils are equal, round, and reactive to light. Conjunctivae are normal.  Neck: No JVD present.  Cardiovascular: Normal rate, regular rhythm and intact distal pulses.  No murmur heard. Pulmonary/Chest: Effort normal and breath sounds normal. She has no wheezes. She has no rales.  Abdominal: Soft. Bowel sounds are normal. There is no rebound.  Musculoskeletal:        General: No edema.  Lymphadenopathy:    She has no cervical adenopathy.  Neurological: She is alert and oriented to person, place, and time. No cranial nerve deficit.  Skin: Skin is warm and dry.  Psychiatric: She has a normal mood and affect.  Nursing note and vitals reviewed.         Assessment & Recommendations:   53 y/o [place] American female with hypertension, hyperlipidemia, type 2 DM, moderate obesity.  Chest pain, shortness of breath: One occasional episode. EKG with no infarct. I do not think this was MI. Previous workup with echocardiogram and exercise nuclear stress test little over a year ago was unremarkable. Recommend Aspirin and SL NTG.  I will see her back in 3 months.      Nigel Mormon, MD [facility] [place] Pager: [phone] Office: [phone] If no answer Cell [phone]

## 2018-12-26 ENCOUNTER — Other Ambulatory Visit: Payer: Self-pay | Admitting: Registered"

## 2018-12-26 DIAGNOSIS — Z20822 Contact with and (suspected) exposure to covid-19: Secondary | ICD-10-CM

## 2018-12-28 LAB — NOVEL CORONAVIRUS, NAA: SARS-CoV-2, NAA: NOT DETECTED

## 2019-02-26 ENCOUNTER — Ambulatory Visit: Payer: 59 | Admitting: Cardiology

## 2019-02-26 DIAGNOSIS — I10 Essential (primary) hypertension: Secondary | ICD-10-CM | POA: Insufficient documentation

## 2019-02-26 NOTE — Progress Notes (Signed)
No show

## 2019-03-02 DIAGNOSIS — Z8616 Personal history of COVID-19: Secondary | ICD-10-CM

## 2019-03-02 HISTORY — DX: Personal history of COVID-19: Z86.16

## 2019-03-23 ENCOUNTER — Ambulatory Visit: Payer: 59 | Attending: Internal Medicine

## 2019-03-23 ENCOUNTER — Ambulatory Visit
Admission: RE | Admit: 2019-03-23 | Discharge: 2019-03-23 | Disposition: A | Discharge status: Home / Self Care | Payer: 59 | Source: Ambulatory Visit | Attending: Family Medicine | Admitting: Family Medicine

## 2019-03-23 ENCOUNTER — Other Ambulatory Visit: Payer: Self-pay | Admitting: Family Medicine

## 2019-03-23 ENCOUNTER — Other Ambulatory Visit: Payer: Self-pay

## 2019-03-23 DIAGNOSIS — R52 Pain, unspecified: Secondary | ICD-10-CM

## 2019-03-23 DIAGNOSIS — Z20822 Contact with and (suspected) exposure to covid-19: Secondary | ICD-10-CM

## 2019-03-24 LAB — NOVEL CORONAVIRUS, NAA: SARS-CoV-2, NAA: NOT DETECTED

## 2019-06-25 ENCOUNTER — Encounter: Payer: Self-pay | Admitting: Neurology

## 2019-06-25 ENCOUNTER — Ambulatory Visit (INDEPENDENT_AMBULATORY_CARE_PROVIDER_SITE_OTHER): Payer: 59 | Admitting: Neurology

## 2019-06-25 ENCOUNTER — Other Ambulatory Visit: Payer: Self-pay

## 2019-06-25 VITALS — BP 108/78 | HR 70 | Temp 97.8°F | Ht 63.0 in | Wt 231.0 lb

## 2019-06-25 DIAGNOSIS — F519 Sleep disorder not due to a substance or known physiological condition, unspecified: Secondary | ICD-10-CM

## 2019-06-25 DIAGNOSIS — R002 Palpitations: Secondary | ICD-10-CM | POA: Diagnosis not present

## 2019-06-25 DIAGNOSIS — F5102 Adjustment insomnia: Secondary | ICD-10-CM

## 2019-06-25 DIAGNOSIS — G4733 Obstructive sleep apnea (adult) (pediatric): Secondary | ICD-10-CM

## 2019-06-25 DIAGNOSIS — F5109 Other insomnia not due to a substance or known physiological condition: Secondary | ICD-10-CM | POA: Diagnosis not present

## 2019-06-25 DIAGNOSIS — Z6841 Body Mass Index (BMI) 40.0 and over, adult: Secondary | ICD-10-CM

## 2019-06-25 NOTE — Patient Instructions (Signed)

## 2019-06-25 NOTE — Progress Notes (Signed)
SLEEP MEDICINE CLINIC   Provider:  Melvyn Novas, [place]  Primary Care Physician:  Lewis Moccasin, MD     Chief Complaint  Patient presents with  . Follow-up    pt alone, rm 10 presented today to reevaluate OSA. in 2019 she was told to use dental device or CPAP. 25+ yrs ago she was unable to tolerate CPAP. she never started a dental device because covid hit. she states that since Jan she has developed a sensation where she is choking in her sleep even when she elevates head of bed.     HPI:  Angela Mitchell is a 54 y.o. female patient , and seen here in a re-referral on 06-25-2019- from Dr. Maryelizabeth Rowan and with the knowledge of Dr. Rosemary Holms of [facility]. Angela Mitchell is here today to be reevaluated.  She has a new concern about her sleep she wakes up choking feeling that air is stuck like a bolus feeling in her throat and she can neither inhale nor exhale.  This is a very scary phenomenon and of course it leaves her awake and somewhat shaking.  When we saw her 2 years ago the patient had undergone a home sleep test ( 09-19-2017)  which had diagnosed a moderately severe sleep apnea the AHI was 26.6, there was some additional snoring, she had no prolonged oxygen desaturation time and no tachycardia or bradycardia was noted.  She had endorsed the Epworth Sleepiness Scale at the time was only one-point and the fatigue severity score at 21 points.  Her husband had said that she was snoring and she had recently spells of vertigo, hypotension near syncope she also felt more fatigued and sleepy as it would fit reflected in her questionnaires.    I recommended that I recommended at the time that CPAP is one treatment option but that this type of apnea may also respond well to a dental device, and weight loss would be strongly recommended.  I did state that if the patient agrees I will order an auto titration CPAP with a pressure range from 5 through 18 cmH2O with heated humidity.   Apparently this order was never fulfilled all the patient was never asked which way she wanted to be treated.  So she is here today as an untreated apnea patient with a new phenomenon-sleep choking.  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  I also reviewed the current medications.  The patient reports no polydipsia or polyuria she does not have to go to the bathroom a lot at night, and there may even be some improvement in the snoring that was reported 2 years ago.  Today's Epworth sleepiness score is endorsed at 0 points and fatigue severity is endorsed at 9 out of 63 points.  So we are here to evaluate sleep choking.     6-6-2019_ I have the pleasure of meeting with Angela Mitchell today who is a new patient to our practice.  She is seen on 04 August 2017 upon referral by her cardiologist she also just recently established herself with a new primary care physician, Dr. Maryelizabeth Rowan. At [facility] she was also seen by PA Arna Medici, who remarked on her history of elevated body mass index, diabetes, hypotension, GERD,  and high suspicion for obstructive sleep apnea  to be present.   The patient also has retrognathia.  She had presented with feeling unwell and nearly fainting but not losing awareness, being dizzy , drifting to the left- about 3 times in this calendar year.   Feeling flushed, drenched in sweat. Felt lightheaded, nauseated,- she also felt palpitations. 95/50 mmHg BP and Blood glucose was 100,She drank some pickle brine and felt her BP improved. [facility] had been read as normal, she did not have any elevated cardiac enzymes.  She is currently on Invokana, atorvastatin, nortriptyline, pantoprazole, ranitidine,Tradjenta, and she takes prn Tylenol,   Excedrin Migraine. She has an IUD.   Sleep habits are as follows: Her bedtime varies, no routine- no ritual. Latest 10 o' clock.  On Friday or Saturday night she will go to [employer] to get a cup of coffee and then goes to sleep. She sleeps with TV or Video on the labtop in her bedroom, she listens to bible audio books. She looks at the clock , and she is anxious. She is never at rest, neither her mind.  She wears headphones.  She sleeps on her side, on one pillow for head support.  She has noticed that she can go to the bathroom 2-3 times but only went one time when she took melatonin.  Her alarm is set for 6 AM she does use the snooze button a couple of times usually rises by around 630.  She feels not rested or refreshed in the morning.  She sleeps on average 5 to 6 hours a night.  She gets up at the same time on Saturday and Sunday. Husband uses CPAP.   Sleep medical history and family sleep history: no family history of OSA.  Patient has no ENT surgery, wisdom teeth removed. 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    Social history: lives with husband and brother in law, children 17 year old daughter and 71 year old, grandsons 61 and 53 years old.  Non smoker, non ETOH, caffeinated drinks, 3-5 a week day.  Daytime work , in the [place].  She works now form home. Not going to gym      Review of Systems: Out of a complete 14 system review, the patient complains of only the following symptoms, and all other reviewed systems are negative.  snoring, choking.    Lost weight down to 208 pounds in January 2020- now, after the pandemic, back to 231.   Epworth score  0/ 24 , Fatigue severity score 21  , depression score   Sleep choking.   Social History   Socioeconomic History  . Marital status: Married    Spouse name: Not on file  . Number of children: 2  . Years of education: Not on file  . Highest education level: Not on file  Occupational  History  . Not on file  Tobacco Use  . Smoking status: Never Smoker  . Smokeless tobacco: Never Used  Substance and Sexual Activity  . Alcohol use: No  . Drug use: No  . Sexual activity: Yes    Birth control/protection: I.U.D.    Comment: MIRENA IUD  Other Topics Concern  . Not on file  Social History Narrative  . Not on file   Social Determinants of Health   Financial Resource Strain:   . Difficulty of Paying Living Expenses:   Food Insecurity:   . Worried About Charity fundraiser in  the Last Year:   . Ran Out of Food in the Last Year:   Transportation Needs:   . Freight forwarder (Medical):   Marland Kitchen Lack of Transportation (Non-Medical):   Physical Activity:   . Days of Exercise per Week:   . Minutes of Exercise per Session:   Stress:   . Feeling of Stress :   Social Connections:   . Frequency of Communication with Friends and Family:   . Frequency of Social Gatherings with Friends and Family:   . Attends Religious Services:   . Active Member of Clubs or Organizations:   . Attends Banker Meetings:   Marland Kitchen Marital Status:   Intimate Partner Violence:   . Fear of Current or Ex-Partner:   . Emotionally Abused:   Marland Kitchen Physically Abused:   . Sexually Abused:     Family History  Problem Relation Age of Onset  . Diabetes Mother   . Hypertension Father   . Hyperlipidemia Father   . Diabetes Father   . Diabetes Maternal Grandmother     Past Medical History:  Diagnosis Date  . Acid reflux   . Anxiety   . Anxiety   . Bronchitis   . Chlamydia   . Diabetes mellitus   . Gonorrhea   . H/O sickle cell trait   . Herpes   . Hypercholesteremia   . Hypertension   . Migraines 2013   with aura  . Yeast infection     Past Surgical History:  Procedure Laterality Date  . CESAREAN SECTION    . DILATION AND CURETTAGE OF UTERUS    . WISDOM TOOTH EXTRACTION      Current Outpatient Medications  Medication Sig Dispense Refill  . aspirin EC 81 MG tablet Take  81 mg by mouth daily.    Marland Kitchen atorvastatin (LIPITOR) 20 MG tablet Take 20 mg by mouth daily.    Marland Kitchen azelastine (ASTELIN) 0.1 % nasal spray Place 2 sprays into both nostrils 2 (two) times daily as needed.     Marland Kitchen escitalopram (LEXAPRO) 5 MG tablet Take 3 tablets by mouth daily.    . hydrochlorothiazide (HYDRODIURIL) 25 MG tablet Take 1 tablet by mouth daily.    . Insulin Glargine (BASAGLAR KWIKPEN) 100 UNIT/ML Inject 42 Units into the skin daily.    Marland Kitchen linagliptin (TRADJENTA) 5 MG TABS tablet Take 5 mg by mouth daily.    . meclizine (ANTIVERT) 25 MG tablet Take 1 tablet by mouth as needed.    . montelukast (SINGULAIR) 10 MG tablet Take 10 mg by mouth daily.    Marland Kitchen OZEMPIC, 1 MG/DOSE, 2 MG/1.5ML SOPN Inject 1 mg into the skin once a week.     Current Facility-Administered Medications  Medication Dose Route Frequency Provider Last Rate Last Admin  . aspirin EC tablet 81 mg  81 mg Oral Daily Patwardhan, Anabel Bene, MD        Allergies as of 06/25/2019  . (No Known Allergies)    Vitals: BP 108/78   Pulse 70   Temp 97.8 F (36.6 C)   Ht 5\' 3"  (1.6 m)   Wt 231 lb (104.8 kg)   BMI 40.92 kg/m  Last Weight:  Wt Readings from Last 1 Encounters:  06/25/19 231 lb (104.8 kg)   06/27/19 mass index is 40.92 kg/m.     Last Height:   Ht Readings from Last 1 Encounters:  06/25/19 5\' 3"  (1.6 m)    Physical exam:  General: The patient is awake,  alert and appears not in acute distress. The patient is well groomed. Head: Normocephalic, atraumatic. Neck is supple. Mallampati 4  neck circumference:17 . Nasal airflow patent , TMJ  Click evident . Retrognathia is seen.  Cardiovascular:  Regular rate and rhythm , without  murmurs or carotid bruit, and without distended neck veins. Respiratory: Lungs are clear to auscultation. Skin:  Without evidence of edema, or rash, left foot injury- stabbed small toe with a tooth pick.  Trunk: BMI is 40.5 kg/m2   Neurologic exam : The patient is awake and alert, oriented  to place and time.   Memory subjective described as intact.  Attention span & concentration ability appears limited,  Speech is fluent, non stop -  without dysarthria, dysphonia or aphasia.  Mood and affect are appropriate.  Cranial nerves: Loss of smell and taste in February-  Did not get tested. ,meantime has had both vaccines.  Pupils are equal and briskly reactive to light. Funduscopic exam deferred.  Extraocular movements  in vertical and horizontal planes intact and without nystagmus. Visual fields by finger perimetry are intact. Hearing intact.  Facial sensation intact to fine touch Facial motor strength is symmetric and tongue and uvula move midline. Shoulder shrug was symmetrical.   Motor exam: Normal tone, muscle bulk and symmetric strength in all extremities. Sensory:  Fine touch, pinprick and vibration were tested in all extremities. Proprioception tested in the upper extremities was normal. Coordination:  Finger-to-nose maneuver normal without evidence of ataxia, dysmetria or tremor. Gait and station: Patient walks without assistive device  Turns with  3 Steps.  Deep tendon reflexes: in the upper and lower extremities are symmetric . Babinski maneuver deferred.  Assessment:  After physical and neurologic examination, review of laboratory studies,  Personal review of imaging studies, reports of other /same  Imaging studies, results of polysomnography and / or neurophysiology testing and pre-existing records as far as provided in visit., my assessment is   1) still having apnea, but not being treated.  Sleep apnea risk factors have not changed. For OSA  she has multiple risk factors including a BMI over 40, and neck circumference that is larger than average, high-grade Mallampati, and significant retrognathia.  2) nausea - from [facility].  The patient was advised of the nature of the diagnosed disorder , the treatment options and the  risks for general health and wellness arising  from not treating the condition.   I spent more than 30 minutes of RV face to face time with the patient.   Plan:  Treatment plan and additional workup :  Repeat a sleep study on HST- sleep choking since January 2021.  unlikely post covid changes. Had loss of smell and taste in March 2021. Now fully vaccinated.     Melvyn Novas, MD 06/25/2019, 3:53 PM  Certified in Neurology by ABPN Certified in Sleep Medicine by [facility] [address], [address] [place], [place] [address]

## 2019-07-18 ENCOUNTER — Ambulatory Visit (INDEPENDENT_AMBULATORY_CARE_PROVIDER_SITE_OTHER): Payer: 59 | Admitting: Neurology

## 2019-07-18 DIAGNOSIS — G4733 Obstructive sleep apnea (adult) (pediatric): Secondary | ICD-10-CM

## 2019-07-18 DIAGNOSIS — F5102 Adjustment insomnia: Secondary | ICD-10-CM

## 2019-07-18 DIAGNOSIS — R002 Palpitations: Secondary | ICD-10-CM

## 2019-07-18 DIAGNOSIS — Z6841 Body Mass Index (BMI) 40.0 and over, adult: Secondary | ICD-10-CM

## 2019-08-10 NOTE — Progress Notes (Signed)
Cc Dr. Duanne Guess, PCP This HST recorded a severe apnea degree with an AHI of 72/h.  there is additional snoring recorded, as indicated by RDI. The  patient experienced tachy-bradycardia and many, many brief  oxygen desaturations. There was brady-tachycardia evident but  HST will not allow to differentiate if this was in sinus rhythm  or not.   CPAP will be needed to address any apnea of this degree. I can  make arrangements for an auto-CPAP but suspect that the patient  will require higher CPAP pressures for alleviation of symptoms  than are usually well tolerated. This could require a change to  BiPAP.

## 2019-08-10 NOTE — Patient Instructions (Signed)
Study data were downloaded on 07-13-2019    Patient Information     First Name: Angela Last Name: Mitchell ID: [id number]  Birth Date: 1966-02-24 Age: 54 Gender: female  Referring Provider: Lewis Moccasin, MD BMI: 41.0 (W=231 lb, H=5' 3'')  Neck Circ.:  17 '' Epworth:  0/24   Sleep Study Information    Study Date: 07/18/2019 S/H/A Version: [id number] / 4.1.1528 / 34  History:    Mrs. Fons is here today to be reevaluated.  She has a new concern about her sleep: She wakes up choking and having a bolus feeling in her throat, reports that she can neither inhale nor exhale.  This is a very scary phenomenon and of course it leaves her awake and somewhat shaken.  When we saw her 2 years ago, the patient had undergone a home sleep test ( 09-19-2017)  which had diagnosed a moderately severe sleep apnea the AHI was 26.6/h, there was some additional snoring, she had no prolonged oxygen desaturation time and no tachycardia or bradycardia was noted.  She had endorsed the Epworth Sleepiness Scale at the time was only one-point and the fatigue severity score at 21 points.  Her husband had said that she was snoring and she had recently spells of vertigo, hypotension, near syncope. she also felt more fatigued and sleepy as is reflected in her questionnaires.        Summary & Diagnosis:     This HST recorded a severe apnea degree with an AHI of 72/h. There was additional snoring  recorded, as indicated by RDI. The patient experienced tachy-bradycardia and many, many  brief oxygen desaturations.   Recommendations:      CPAP will be needed to address any apnea of this degree. I can make arrangements for an auto-CPAP but suspect that the patient will require higher CPAP pressures for alleviation of symptoms than are usually well tolerated. This could require a change to BiPAP.   Interpreting Physician: Melvyn Novas, MD    Sleep Summary  Oxygen Saturation Statistics   Start Study Time: End Study  Time: Total Recording Time:       10:02:41 PM        5:45:46 AM         7 h, 43 min  Total Sleep Time % REM of Sleep Time:  5 h, 59 min  17.5    Mean: 98 Minimum: 84 Maximum: 97  Mean of Desaturations Nadirs (%):   90  Oxygen Desaturation. %: 4-9 10-20 >20 Total  Events Number Total  228 100.0  0 0.0  0 0.0  228 100.0  Oxygen Saturation <90 <=88 <85 <80 <70  Duration (minutes): Sleep % 10.7 4.7 3.0 1.3 0.0 0.0 0.0 0.0 0.0 0.0     Respiratory Indices      Total Events REM NREM All Night  pRDI:  369  pAHI:  367 ODI:  228  pAHIc:  31  % CSR: 0.0 72.6 72.6 76.4 9.6 72.4 71.9 36.6 5.2 72.4 72.0 44.7 6.1       Pulse Rate Statistics during Sleep (BPM)      Mean:  71 Minimum: 35 Maximum: 133    Indices are calculated using technically valid sleep time of 5 h, 5 min.

## 2019-08-10 NOTE — Addendum Note (Signed)
Addended by: Melvyn Novas on: 08/10/2019 02:42 PM   Modules accepted: Orders

## 2019-08-10 NOTE — Procedures (Signed)
  Patient Information     First Name: Angela Last Name: Mitchell ID: [id number]  Birth Date: 1965-10-29 Age: 54 Gender: female  Referring Provider: Lewis Moccasin, MD BMI: 41.0 (W=231 lb, H=5' 3'')  Neck Circ.:  17 '' Epworth:  0/24   Sleep Study Information    Study Date: 07/18/2019 S/H/A Version: [id number] / 4.1.1528 / 37  History:    [name] is here to be reevaluated.  She has a new concern about her sleep: She wakes up choking and having a bolus feeling in her throat, reports that she can neither inhale nor exhale.  This is a very scary phenomenon and of course it leaves her awake and somewhat shaken.  When we saw her 2 years ago, the patient had undergone a home sleep test ( 09-19-2017)  which had diagnosed a moderately severe sleep apnea the AHI was 26.6/h. There was some additional snoring, she had no prolonged oxygen desaturation and no tachycardia or bradycardia was noted.  She had endorsed the Epworth Sleepiness Scale at the time was only one-point and the fatigue severity score at 21 points.  Her husband said that she was snoring and she had recently experienced spells of vertigo, hypotension, near syncope. She has also felt more fatigued and sleepy which is reflected in her questionnaires.        Summary & Diagnosis:     This HST recorded a severe apnea degree with an AHI of 72/h. there is additional snoring  recorded, as indicated by RDI. The patient experienced  tachy-bradycardia and many, many brief oxygen  desaturations. There was brady-tachycardia evident but HST will not allow to differentiate if this was in sinus rhythm or not.   Recommendations:      CPAP will be needed to address any apnea of this degree. I can make arrangements for an auto-CPAP but suspect that the patient will require higher CPAP pressures for alleviation of symptoms than are usually well tolerated. This could require a change to BiPAP.   Interpreting Physician: Melvyn Novas, MD                 Sleep Summary  Oxygen Saturation Statistics   Start Study Time: End Study Time: Total Recording Time:       10:02:41 PM        5:45:46 AM         7 h, 43 min  Total Sleep Time % REM of Sleep Time:  5 h, 59 min  17.5    Mean: 98 Minimum: 84 Maximum: 97  Mean of Desaturations Nadirs (%):   90  Oxygen Desaturation. %: 4-9 10-20 >20 Total  Events Number Total  228 100.0  0 0.0  0 0.0  228 100.0  Oxygen Saturation <90 <=88 <85 <80 <70  Duration (minutes): Sleep % 10.7 4.7 3.0 1.3 0.0 0.0 0.0 0.0 0.0 0.0     Respiratory Indices      Total Events REM NREM All Night  pRDI:  369  pAHI:  367 ODI:  228  pAHIc:  31  % CSR: 0.0 72.6 72.6 76.4 9.6 72.4 71.9 36.6 5.2 72.4 72.0 44.7 6.1       Pulse Rate Statistics during Sleep (BPM)      Mean:  71 Minimum: 35 Maximum: 133    Indices are calculated using technically valid sleep time of 5 h, 5 min. pRDI/pAHI are calculated using 02-desaturations ? 3%

## 2019-08-13 ENCOUNTER — Telehealth: Payer: Self-pay

## 2019-08-13 NOTE — Telephone Encounter (Signed)
[employer] will deny Cpap titration due to no central apnea on HST. She will need an auto cpap. Discussed with Dr. Vickey Huger and she is ok with ordering auto

## 2019-08-14 ENCOUNTER — Telehealth: Payer: Self-pay | Admitting: Neurology

## 2019-08-14 ENCOUNTER — Encounter: Payer: Self-pay | Admitting: Neurology

## 2019-08-14 NOTE — Telephone Encounter (Signed)
I called pt. I advised pt that Dr. Vickey Huger reviewed their sleep study results and found that pt has severe sleep apnea. Dr. Vickey Huger recommends that pt starts auto CPAP. I reviewed PAP compliance expectations with the pt. Pt is agreeable to starting a CPAP. I advised pt that an order will be sent to a DME, [facility], and [facility] will call the pt within about one week after they file with the pt's insurance. [facility] will show the pt how to use the machine, fit for masks, and troubleshoot the CPAP if needed. A follow up appt was made for insurance purposes with Dr. Vickey Huger on 8/24 at 3:30 pm. Pt verbalized understanding to arrive 15 minutes early and bring their CPAP. A letter with all of this information in it will be mailed to the pt as a reminder. I verified with the pt that the address we have on file is correct. Pt verbalized understanding of results. Pt had no questions at this time but was encouraged to call back if questions arise. I have sent the order to [facility] and have received confirmation that they have received the order.

## 2019-08-14 NOTE — Telephone Encounter (Signed)
Noted will review when contacting the patient.

## 2019-08-14 NOTE — Telephone Encounter (Signed)
-----   Message from Melvyn Novas, MD sent at 08/10/2019  2:42 PM EDT ----- Cc Dr. Duanne Guess, PCP This HST recorded a severe apnea degree with an AHI of 72/h.  there is additional snoring recorded, as indicated by RDI. The  patient experienced tachy-bradycardia and many, many brief  oxygen desaturations. There was brady-tachycardia evident but  HST will not allow to differentiate if this was in sinus rhythm  or not.   CPAP will be needed to address any apnea of this degree. I can  make arrangements for an auto-CPAP but suspect that the patient  will require higher CPAP pressures for alleviation of symptoms  than are usually well tolerated. This could require a change to  BiPAP.

## 2019-10-23 ENCOUNTER — Ambulatory Visit: Payer: Self-pay | Admitting: Neurology

## 2019-10-23 NOTE — Telephone Encounter (Signed)
Called the patient and advised today's visit was for initial CPAP visit. She has not stared the machine states company never reached out. I advised we will push this apt out, I gave her the company's number and then I advised I would reach out to DME company

## 2020-01-02 ENCOUNTER — Ambulatory Visit (INDEPENDENT_AMBULATORY_CARE_PROVIDER_SITE_OTHER): Payer: 59 | Admitting: Neurology

## 2020-01-02 ENCOUNTER — Encounter: Payer: Self-pay | Admitting: Neurology

## 2020-01-02 VITALS — BP 110/70 | HR 63 | Ht 63.0 in | Wt 235.0 lb

## 2020-01-02 DIAGNOSIS — R002 Palpitations: Secondary | ICD-10-CM

## 2020-01-02 DIAGNOSIS — F519 Sleep disorder not due to a substance or known physiological condition, unspecified: Secondary | ICD-10-CM | POA: Diagnosis not present

## 2020-01-02 DIAGNOSIS — Z9989 Dependence on other enabling machines and devices: Secondary | ICD-10-CM

## 2020-01-02 DIAGNOSIS — Z6841 Body Mass Index (BMI) 40.0 and over, adult: Secondary | ICD-10-CM

## 2020-01-02 DIAGNOSIS — G4733 Obstructive sleep apnea (adult) (pediatric): Secondary | ICD-10-CM | POA: Diagnosis not present

## 2020-01-02 NOTE — Patient Instructions (Signed)
Angela Mitchell experiences air leakage which sometimes wakes her as it dries out her eyes, she also states that the full facemask rests on her nasal bridge and has left a pressure mark.  At this time she has not contacted her DME but I urged her to do so as she is within 30 days of the new prescribed mask and she should be able to exchange and be refitted for a different model.  She may be a candidate for a ResMed F 30 I or a DreamWear mask that covers the mouth but the end of the nose.  This would at least not leak air into the eye.  Alternatively I would like the DME to discuss a chinstrap with her.  She should be shown how to increase the humidity and as she still has 1 bathroom break at night down from 3 or more she is now definitely benefiting from the therapy feels that she sleeps longer and sounder.  The bathroom break can be used to refill the water reservoir as needed.

## 2020-01-02 NOTE — Progress Notes (Signed)
SLEEP MEDICINE CLINIC   Provider:  Melvyn Novas, [place]  Primary Care Physician:  Lewis Moccasin, MD     Chief Complaint  Patient presents with  . Follow-up    pt alone, rm 11. presents today to follow up for initial cpap. she states overall she is getting used to the cpap. still has a few concerns with it but she has continued to use it. DME [employer]    HPI:  Angela Mitchell is a 54 y.o. female patient , and seen here in her first Re visit after sleep study_ 01-02-2020.  There were 1 to the patient has been undergoing a home sleep evaluation as of 18 Jul 2019 which was part of her reevaluation and her home sleep test recorded a severe degree of sleep apnea with an AHI of 72/h additionally loud snoring indicated by RDI she also experienced tachybradycardia and many many brief oxygen desaturations.  So she was placed on an auto titration CPAP and has been using it compliantly 85% of days and over 4 hours for 80% of those.  Her average usage time on days used is 7 hours 10 minutes at night the AutoSet has a minimum pressure of 5 and can go up to a maximum pressure of 16 cm water pressure she also has an EPR-expiratory pressure relief setting-of 2 cmH2O her residual AHI is 1.5/h which is a great resolution in comparison to her baseline.  95th percentile pressure is 15.9 cmH2O so she is right there where she needs to be she does have minor air leakage according to her download but she has experienced leakage at home that has actually woken her up because of the air escape and blew into her eyes.  She is using a FFM covering mask and small size which initially seemed to fit well but now has left a pressure mark of the bridge of the nose.  She had  Been given her booster shot and felt burning and sore- once she finally started CPAP 3 weeks ago, drying her tongue.  Water reservoir is empty in AM.  She may need a chin strap.          in a re-referral on 06-25-2019- from Dr. Maryelizabeth Rowan and with the knowledge of Dr. Rosemary Holms of [facility]. Angela Mitchell is here today to be reevaluated.  She has a new concern about her sleep she wakes up choking feeling that air is stuck like a bolus feeling in her throat and she can neither inhale nor exhale.  This is a very scary phenomenon and of course it leaves her awake and somewhat shaking.  When we saw her 2 years ago the patient had undergone a home sleep test ( 09-19-2017)  which had diagnosed a moderately severe sleep apnea the AHI was 26.6, there was some additional snoring, she had no prolonged oxygen desaturation time and no tachycardia or bradycardia was noted.  She had endorsed the Epworth Sleepiness Scale at the time was only one-point and the fatigue severity score at 21 points.  Her husband had said that she was snoring and she had recently spells of vertigo, hypotension near syncope she also felt more fatigued and sleepy as it would fit reflected in her questionnaires.    I recommended that I recommended at the time that CPAP is one treatment option but that this type of apnea may also respond well to a dental device, and weight loss would be strongly recommended.  I  did state that if the patient agrees I will order an auto titration CPAP with a pressure range from 5 through 18 cmH2O with heated humidity.  Apparently this order was never fulfilled all the patient was never asked which way she wanted to be treated.  So she is here today as an untreated apnea patient with a new phenomenon-sleep choking.  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  I also reviewed the current medications.  The patient reports no polydipsia or polyuria she does not have to go to the bathroom a lot at night, and there may even be some  improvement in the snoring that was reported 2 years ago.  Today's Epworth sleepiness score is endorsed at 0 points and fatigue severity is endorsed at 9 out of 63 points.  So we are here to evaluate sleep choking.     6-6-2019_ I have the pleasure of meeting with Angela Mitchell today who is a new patient to our practice.  She is seen on 04 August 2017 upon referral by her cardiologist she also just recently established herself with a new primary care physician, Dr. Maryelizabeth Rowan. At [facility] she was also seen by PA Arna Medici, who remarked on her history of elevated body mass index, diabetes, hypotension, GERD,  and high suspicion for obstructive sleep apnea to be present.   The patient also has retrognathia.  She had presented with feeling unwell and nearly fainting but not losing awareness, being dizzy , drifting to the left- about 3 times in this calendar year.   Feeling flushed, drenched in sweat. Felt lightheaded, nauseated,- she also felt palpitations. 95/50 mmHg BP and Blood glucose was 100,She drank some pickle brine and felt her BP improved. An [facility] at [facility] had been read as normal, she did not have any elevated cardiac enzymes.  She is currently on Invokana, atorvastatin, nortriptyline, pantoprazole, ranitidine,Tradjenta, and she takes prn Tylenol,  Excedrin Migraine. She has an IUD.   Sleep habits are as follows: Her bedtime varies, no routine- no ritual. Latest 10 o' clock.  On Friday or Saturday night she will go to [employer] to get a cup of coffee and then goes to sleep. She sleeps with TV or Video on the labtop in her bedroom, she listens to bible audio books. She looks at the clock , and she is anxious. She is never at rest, neither her mind.  She wears headphones.  She sleeps on her side, on one pillow for head support.  She has noticed that she can go to the bathroom 2-3 times but only went one time when she took melatonin.  Her alarm is set for 6 AM she  does use the snooze button a couple of times usually rises by around 630.  She feels not rested or refreshed in the morning.  She sleeps on average 5 to 6 hours a night.  She gets up at the same time on Saturday and Sunday. Husband uses CPAP.   Sleep medical history and family sleep history: no family history of OSA.  Patient has no ENT surgery, wisdom teeth removed. 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    Social history: lives with husband and brother in law, children 34 year old daughter and 62 year old,  grandsons [id number] and 53 years old.  Non smoker, non ETOH, caffeinated drinks, 3-5 a week day.  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  She works now form home. Not going to gym      Review of Systems: Out of a complete 14 system review, the patient complains of only the following symptoms, and all other reviewed systems are negative.  snoring, choking.    Lost weight down to 208 pounds in January 2020- now, after the pandemic, back to 231.   Epworth score  0/ 24 , Fatigue severity score 21  , depression score   Sleep choking.   Social History   Socioeconomic History  . Marital status: Married    Spouse name: Not on file  . Number of children: 2  . Years of education: Not on file  . Highest education level: Not on file  Occupational History  . Not on file  Tobacco Use  . Smoking status: Never Smoker  . Smokeless tobacco: Never Used  Substance and Sexual Activity  . Alcohol use: No  . Drug use: No  . Sexual activity: Yes    Birth control/protection: I.U.D.    Comment: MIRENA IUD  Other Topics Concern  . Not on file  Social History Narrative  . Not on file   Social Determinants of Health   Financial Resource Strain:   . Difficulty of Paying Living Expenses: Not on file  Food Insecurity:   . Worried About Programme researcher, broadcasting/film/videounning Out of Food in the Last Year: Not on file  . Ran Out of Food in the Last Year: Not on file  Transportation Needs:   .  Lack of Transportation (Medical): Not on file  . Lack of Transportation (Non-Medical): Not on file  Physical Activity:   . Days of Exercise per Week: Not on file  . Minutes of Exercise per Session: Not on file  Stress:   . Feeling of Stress : Not on file  Social Connections:   . Frequency of Communication with Friends and Family: Not on file  . Frequency of Social Gatherings with Friends and Family: Not on file  . Attends Religious Services: Not on file  . Active Member of Clubs or Organizations: Not on file  . Attends BankerClub or Organization Meetings: Not on file  . Marital Status: Not on file  Intimate Partner Violence:   . Fear of Current or Ex-Partner: Not on file  . Emotionally Abused: Not on file  . Physically Abused: Not on file  . Sexually Abused: Not on file    Family History  Problem Relation Age of Onset  . Diabetes Mother   . Hypertension Father   . Hyperlipidemia Father   . Diabetes Father   . Diabetes Maternal Grandmother     Past Medical History:  Diagnosis Date  . Acid reflux   . Anxiety   . Anxiety   . Bronchitis   . Chlamydia   . Diabetes mellitus   . Gonorrhea   . H/O sickle cell trait   . Herpes   . Hypercholesteremia   . Hypertension   . Migraines 2013   with aura  . Yeast infection     Past Surgical History:  Procedure Laterality Date  . CESAREAN SECTION    . DILATION AND CURETTAGE OF UTERUS    . WISDOM TOOTH EXTRACTION      Current Outpatient Medications  Medication Sig Dispense Refill  . albuterol (VENTOLIN HFA) 108 (90 Base) MCG/ACT  inhaler Inhale into the lungs.    Marland Kitchen aspirin EC 81 MG tablet Take 81 mg by mouth daily.    Marland Kitchen aspirin EC 81 MG tablet Take 81 mg by mouth daily. Swallow whole.    Marland Kitchen azelastine (ASTELIN) 0.1 % nasal spray Place 2 sprays into both nostrils 2 (two) times daily as needed.     Marland Kitchen escitalopram (LEXAPRO) 5 MG tablet Take 3 tablets by mouth daily.    . Fluticasone-Salmeterol (ADVAIR DISKUS) 500-50 MCG/DOSE AEPB  Inhale 1 puff into the lungs 2 (two) times daily.    . hydrochlorothiazide (HYDRODIURIL) 25 MG tablet Take 1 tablet by mouth daily.    . Insulin Glargine (BASAGLAR KWIKPEN) 100 UNIT/ML Inject 42 Units into the skin daily.    Marland Kitchen linagliptin (TRADJENTA) 5 MG TABS tablet Take 5 mg by mouth daily.    . metFORMIN (GLUCOPHAGE-XR) 500 MG 24 hr tablet Take 500 mg by mouth daily.    . montelukast (SINGULAIR) 10 MG tablet Take 10 mg by mouth daily.    Marland Kitchen OZEMPIC, 1 MG/DOSE, 2 MG/1.5ML SOPN Inject 1 mg into the skin once a week.    . rosuvastatin (CRESTOR) 20 MG tablet Take 20 mg by mouth at bedtime.    Marland Kitchen VITAMIN D, CHOLECALCIFEROL, PO Take 5,000 Units by mouth daily.     No current facility-administered medications for this visit.    Allergies as of 01/02/2020  . (No Known Allergies)    Vitals: BP 110/70   Pulse 63   Ht 5\' 3"  (1.6 m)   Wt 235 lb (106.6 kg)   BMI 41.63 kg/m  Last Weight:  Wt Readings from Last 1 Encounters:  01/02/20 235 lb (106.6 kg)   13/03/21 mass index is 41.63 kg/m.     Last Height:   Ht Readings from Last 1 Encounters:  01/02/20 5\' 3"  (1.6 m)    Physical exam:  General: The patient is awake, alert and appears not in acute distress. The patient is well groomed. Head: Normocephalic, atraumatic. Neck is supple. Mallampati 4  neck circumference:17 . Nasal airflow patent , TMJ  Click evident . Retrognathia is seen.  Cardiovascular:  Regular rate and rhythm , without  murmurs or carotid bruit, and without distended neck veins. Respiratory: Lungs are clear to auscultation. Skin:  Without evidence of edema, or rash, left foot injury- stabbed small toe with a tooth pick.  Trunk: BMI is 40.5 kg/m2   Neurologic exam : The patient is awake and alert, oriented to place and time.   Memory subjective described as intact.  Attention span & concentration ability appears limited,  Speech is fluent, non stop -  without dysarthria, dysphonia or aphasia.  Mood and affect are  appropriate.  Cranial nerves: Loss of smell and taste in February-  Did not get tested. ,meantime has had both vaccines.  Pupils are equal and briskly reactive to light. Funduscopic exam deferred.  Extraocular movements  in vertical and horizontal planes intact and without nystagmus. Visual fields by finger perimetry are intact. Hearing intact.  Facial sensation intact to fine touch Facial motor strength is symmetric and tongue and uvula move midline. Shoulder shrug was symmetrical.   Motor exam: Normal tone, muscle bulk and symmetric strength in all extremities. Deep tendon reflexes: in the upper and lower extremities are symmetric . Babinski maneuver deferred.  Assessment:  After physical and neurologic examination, review of laboratory studies,  Personal review of imaging studies, reports of other /same  Imaging studies, results of  polysomnography and / or neurophysiology testing and pre-existing records as far as provided in visit., my assessment is   1) still having apnea, but not being treated.  Sleep apnea risk factors have not changed. For OSA  she has multiple risk factors including a BMI over 40, and neck circumference that is larger than average, high-grade Mallampati, and significant retrognathia.  2) nausea - from [facility].  The patient was advised of the nature of the diagnosed disorder , the treatment options and the  risks for general health and wellness arising from not treating the condition.   I spent more than 18 minutes of RV face to face time with the patient.   Plan:  Treatment plan and additional workup :  Mrs. Pfahler experiences air leakage which sometimes wakes her as it dries out her eyes, she also states that the full facemask rests on her nasal bridge and has left a pressure mark.  At this time she has not contacted her DME but I urged her to do so as she is within 30 days of the new prescribed mask and she should be able to exchange and be refitted for a different  model.  She may be a candidate for a ResMed F 30 I or a DreamWear mask that covers the mouth but the end of the nose.  This would at least not leak air into the eye.  Alternatively I would like the DME to discuss a chinstrap with her.  She should be shown how to increase the humidity and as she still has 1 bathroom break at night down from 3 or more she is now definitely benefiting from the therapy feels that she sleeps longer and sounder.  The bathroom break can be used to refill the water reservoir as needed.   Melvyn Novas, MD 01/02/2020, 4:21 PM  Certified in Neurology by ABPN Certified in Sleep Medicine by [facility] [address], [address] [place], [place] [address]

## 2020-02-14 ENCOUNTER — Other Ambulatory Visit: Payer: Self-pay | Admitting: Family Medicine

## 2020-02-14 DIAGNOSIS — Z1231 Encounter for screening mammogram for malignant neoplasm of breast: Secondary | ICD-10-CM

## 2020-05-12 ENCOUNTER — Ambulatory Visit
Admission: RE | Admit: 2020-05-12 | Discharge: 2020-05-12 | Disposition: A | Discharge status: Home / Self Care | Payer: 59 | Source: Ambulatory Visit | Attending: Family Medicine | Admitting: Family Medicine

## 2020-05-12 ENCOUNTER — Other Ambulatory Visit: Payer: Self-pay

## 2020-05-12 DIAGNOSIS — Z1231 Encounter for screening mammogram for malignant neoplasm of breast: Secondary | ICD-10-CM

## 2021-01-01 NOTE — Progress Notes (Addendum)
PATIENT: Angela Mitchell DOB: [birth year]  REASON FOR VISIT: follow up HISTORY FROM: patient   HISTORY OF PRESENT ILLNESS: Today 01/05/21 Angela Mitchell is a 55 y.o. female with a history of OSA on CPAP. She is here today for a follow up. Her download below She has no complaints with her machine at this time. She reports that it helps with her sleep and she has decreased headaches. Her download below suggest that she is wearing her machine for 21 of the 30 days and for >4 hours for a compliance of 70%. She has a good treatment of AHI 1.8 with and auto pressure setting. Patient endorses that her sleep schedule has not been consistent since losing her mother in September. Usually wakes up around 230AM watches tablet until she falls back asleep. Tried melationin for 2 weeks- reports it helped some. She has no questions or concerns at this time.     HISTORY  01/02/20 Angela Mitchell is a 55 y.o. female patient , and seen here in her first Re visit after sleep study_ 01-02-2020.  There were 1 to the patient has been undergoing a home sleep evaluation as of 18 Jul 2019 which was part of her reevaluation and her home sleep test recorded a severe degree of sleep apnea with an AHI of 72/h additionally loud snoring indicated by RDI she also experienced tachybradycardia and many many brief oxygen desaturations.  So she was placed on an auto titration CPAP and has been using it compliantly 85% of days and over 4 hours for 80% of those.  Her average usage time on days used is 7 hours 10 minutes at night the AutoSet has a minimum pressure of 5 and can go up to a maximum pressure of 16 cm water pressure she also has an EPR-expiratory pressure relief setting-of 2 cmH2O her residual AHI is 1.5/h which is a great resolution in comparison to her baseline.  95th percentile pressure is 15.9 cmH2O so she is right there where she needs to be she does have minor air leakage according to her download but she has experienced  leakage at home that has actually woken her up because of the air escape and blew into her eyes.  She is using a FFM covering mask and small size which initially seemed to fit well but now has left a pressure mark of the bridge of the nose.  REVIEW OF SYSTEMS: Out of a complete 14 system review of symptoms, the patient complains only of the following symptoms, and all other reviewed systems are negative.  FSS 21 ESS 0  ALLERGIES: No Known Allergies  HOME MEDICATIONS: Outpatient Medications Prior to Visit  Medication Sig Dispense Refill   albuterol (VENTOLIN HFA) 108 (90 Base) MCG/ACT inhaler Inhale into the lungs.     aspirin EC 81 MG tablet Take 81 mg by mouth daily.     aspirin EC 81 MG tablet Take 81 mg by mouth daily. Swallow whole.     azelastine (ASTELIN) 0.1 % nasal spray Place 2 sprays into both nostrils 2 (two) times daily as needed.      escitalopram (LEXAPRO) 5 MG tablet Take 3 tablets by mouth daily.     Fluticasone-Salmeterol (ADVAIR) 500-50 MCG/DOSE AEPB Inhale 1 puff into the lungs 2 (two) times daily.     hydrochlorothiazide (HYDRODIURIL) 25 MG tablet Take 1 tablet by mouth daily.     Insulin Glargine (BASAGLAR KWIKPEN) 100 UNIT/ML Inject 42 Units into the skin daily.  linagliptin (TRADJENTA) 5 MG TABS tablet Take 5 mg by mouth daily.     metFORMIN (GLUCOPHAGE-XR) 500 MG 24 hr tablet Take 500 mg by mouth daily.     montelukast (SINGULAIR) 10 MG tablet Take 10 mg by mouth daily.     OZEMPIC, 1 MG/DOSE, 2 MG/1.5ML SOPN Inject 1 mg into the skin once a week.     rosuvastatin (CRESTOR) 20 MG tablet Take 20 mg by mouth at bedtime.     VITAMIN D, CHOLECALCIFEROL, PO Take 5,000 Units by mouth daily.     No facility-administered medications prior to visit.    PAST MEDICAL HISTORY: Past Medical History:  Diagnosis Date   Acid reflux    Anxiety    Anxiety    Bronchitis    Chlamydia    Diabetes mellitus    Gonorrhea    H/O sickle cell trait    Herpes     Hypercholesteremia    Hypertension    Migraines 2013   with aura   Yeast infection     PAST SURGICAL HISTORY: Past Surgical History:  Procedure Laterality Date   CESAREAN SECTION     DILATION AND CURETTAGE OF UTERUS     WISDOM TOOTH EXTRACTION      FAMILY HISTORY: Family History  Problem Relation Age of Onset   Diabetes Mother    Hypertension Father    Hyperlipidemia Father    Diabetes Father    Diabetes Maternal Grandmother    Sleep apnea Neg Hx     SOCIAL HISTORY: Social History   Socioeconomic History   Marital status: Married    Spouse name: Not on file   Number of children: 2   Years of education: Not on file   Highest education level: Not on file  Occupational History   Not on file  Tobacco Use   Smoking status: Never   Smokeless tobacco: Never  Substance and Sexual Activity   Alcohol use: No   Drug use: No   Sexual activity: Yes    Birth control/protection: I.U.D.    Comment: MIRENA IUD  Other Topics Concern   Not on file  Social History Narrative   Not on file   Social Determinants of Health   Financial Resource Strain: Not on file  Food Insecurity: Not on file  Transportation Needs: Not on file  Physical Activity: Not on file  Stress: Not on file  Social Connections: Not on file  Intimate Partner Violence: Not on file      PHYSICAL EXAM  Vitals:   01/05/21 1458  BP: 93/63  Pulse: 75  Weight: 98.4 kg  Height: _0  (1.6 m)   Body mass index is 38.44 kg/m.  Generalized: Well developed, in no acute distress  Chest: Lungs clear to auscultation bilaterally  Neurological examination  Mentation: Alert oriented to time, place, history taking. Follows all commands speech and language fluent Cranial nerve II-XII: Extraocular movements were full, visual field were full on confrontational test Head turning and shoulder shrug  were normal and symmetric. Motor: The motor testing reveals 5 over 5 strength of all 4 extremities. Good symmetric  motor tone is noted throughout.  Sensory: Sensory testing is intact to soft touch on all 4 extremities. No evidence of extinction is noted.  Gait and station: Gait is normal.    DIAGNOSTIC DATA (LABS, IMAGING, TESTING) - I reviewed patient records, labs, notes, testing and imaging myself where available.  Lab Results  Component Value Date   WBC 11.1 (  H) 04/03/2011   HGB 11.3 (L) 04/03/2011   HCT 35.2 (L) 04/03/2011   MCV 80.5 04/03/2011   PLT 401 (H) 04/03/2011      Component Value Date/Time   NA 132 (L) 05/13/2010 0813   K 3.7 05/13/2010 0813   CL 101 05/13/2010 0813   CO2 26 05/13/2010 0813   GLUCOSE 211 (H) 05/13/2010 0813   BUN 8 05/13/2010 0813   CREATININE 0.68 05/13/2010 0813   CALCIUM 8.7 05/13/2010 0813   PROT 7.2 05/13/2010 0813   ALBUMIN 3.4 (L) 05/13/2010 0813   AST 22 05/13/2010 0813   ALT 19 05/13/2010 0813   ALKPHOS 46 05/13/2010 0813   BILITOT 0.3 05/13/2010 0813   GFRNONAA >60 05/13/2010 0813   GFRAA  05/13/2010 0813    >60        The eGFR has been calculated using the MDRD equation. This calculation has not been validated in all clinical situations. eGFR's persistently <60 mL/min signify possible Chronic Kidney Disease.   No results found for: CHOL, HDL, LDLCALC, LDLDIRECT, TRIG, CHOLHDL No results found for: HGBA1C No results found for: VITAMINB12 No results found for: TSH    ASSESSMENT AND PLAN 55 y.o. year old female  has a past medical history of Acid reflux, Anxiety, Anxiety, Bronchitis, Chlamydia, Diabetes mellitus, Gonorrhea, H/O sickle cell trait, Herpes, Hypercholesteremia, Hypertension, Migraines (2013), and Yeast infection. here with:  OSA on CPAP  - CPAP compliance good - Good treatment of AHI  - Encourage patient to use CPAP nightly and > 4 hours each night  2. Sleep Disturbance  - Try melatonin 1-5 mg  2 hours before bedtime - Discussed good sleep hygiene and avoid TV/tablet in bed.   - F/U in 1 year or sooner if  needed    Ward Givens, MSN, NP-C 01/05/2021, 3:05 PM [facility] [address], [place], [place] [address] [phone]

## 2021-01-05 ENCOUNTER — Ambulatory Visit (INDEPENDENT_AMBULATORY_CARE_PROVIDER_SITE_OTHER): Payer: BLUE CROSS/BLUE SHIELD | Admitting: Adult Health

## 2021-01-05 ENCOUNTER — Encounter: Payer: Self-pay | Admitting: Adult Health

## 2021-01-05 VITALS — BP 93/63 | HR 75 | Ht 63.0 in | Wt 217.0 lb

## 2021-01-05 DIAGNOSIS — Z9989 Dependence on other enabling machines and devices: Secondary | ICD-10-CM

## 2021-01-05 DIAGNOSIS — G4733 Obstructive sleep apnea (adult) (pediatric): Secondary | ICD-10-CM | POA: Diagnosis not present

## 2021-01-05 DIAGNOSIS — F5102 Adjustment insomnia: Secondary | ICD-10-CM | POA: Diagnosis not present

## 2021-04-27 ENCOUNTER — Other Ambulatory Visit: Payer: Self-pay

## 2021-04-27 ENCOUNTER — Encounter (HOSPITAL_BASED_OUTPATIENT_CLINIC_OR_DEPARTMENT_OTHER): Payer: Self-pay | Admitting: Orthopedic Surgery

## 2021-04-27 NOTE — Progress Notes (Addendum)
Spoke w/ via phone for pre-op interview--- pt Lab needs dos----    [employer], urine preg, ekg           Lab results------ no COVID test -----patient states asymptomatic no test needed Arrive at ------- 0530 on 04-30-2021 NPO after MN NO Solid Food.  Clear liquids from MN until--- 0430 Med rec completed Medications to take morning of surgery ----- none Diabetic medication ----- do not do any insulin morning of surgery Patient instructed no nail polish to be worn day of surgery Patient instructed to bring photo id and insurance card day of surgery Patient aware to have Driver (ride ) / caregiver  for 24 hours after surgery --husband, Angela Mitchell Patient Special Instructions ----- n/a Pre-Op special Istructions ----- n/a Patient verbalized understanding of instructions that were given at this phone interview. Patient denies shortness of breath, chest pain, fever, cough at this phone interview.

## 2021-04-29 ENCOUNTER — Other Ambulatory Visit: Payer: Self-pay | Admitting: Orthopedic Surgery

## 2021-04-29 LAB — SARS CORONAVIRUS 2 (TAT 6-24 HRS): SARS Coronavirus 2: NEGATIVE

## 2021-04-29 NOTE — Progress Notes (Signed)
Spoke with patient by phone and patient has sinus symptoms and took negative home covid test this am, pt will get pcr covid test this am at [address] ?

## 2021-04-30 ENCOUNTER — Other Ambulatory Visit: Payer: Self-pay

## 2021-04-30 ENCOUNTER — Encounter (HOSPITAL_BASED_OUTPATIENT_CLINIC_OR_DEPARTMENT_OTHER)
Admission: RE | Disposition: A | Discharge status: Home / Self Care | Payer: Self-pay | Source: Home / Self Care | Attending: Orthopedic Surgery

## 2021-04-30 ENCOUNTER — Ambulatory Visit (HOSPITAL_BASED_OUTPATIENT_CLINIC_OR_DEPARTMENT_OTHER): Payer: BLUE CROSS/BLUE SHIELD | Admitting: Anesthesiology

## 2021-04-30 ENCOUNTER — Ambulatory Visit (HOSPITAL_BASED_OUTPATIENT_CLINIC_OR_DEPARTMENT_OTHER)
Admission: RE | Admit: 2021-04-30 | Discharge: 2021-04-30 | Disposition: A | Discharge status: Home / Self Care | Payer: BLUE CROSS/BLUE SHIELD | Attending: Orthopedic Surgery | Admitting: Orthopedic Surgery

## 2021-04-30 ENCOUNTER — Encounter (HOSPITAL_BASED_OUTPATIENT_CLINIC_OR_DEPARTMENT_OTHER): Payer: Self-pay | Admitting: Orthopedic Surgery

## 2021-04-30 DIAGNOSIS — I10 Essential (primary) hypertension: Secondary | ICD-10-CM | POA: Diagnosis not present

## 2021-04-30 DIAGNOSIS — G5601 Carpal tunnel syndrome, right upper limb: Secondary | ICD-10-CM

## 2021-04-30 DIAGNOSIS — E119 Type 2 diabetes mellitus without complications: Secondary | ICD-10-CM

## 2021-04-30 DIAGNOSIS — F419 Anxiety disorder, unspecified: Secondary | ICD-10-CM | POA: Insufficient documentation

## 2021-04-30 DIAGNOSIS — K219 Gastro-esophageal reflux disease without esophagitis: Secondary | ICD-10-CM | POA: Insufficient documentation

## 2021-04-30 DIAGNOSIS — G4733 Obstructive sleep apnea (adult) (pediatric): Secondary | ICD-10-CM | POA: Diagnosis not present

## 2021-04-30 DIAGNOSIS — J45909 Unspecified asthma, uncomplicated: Secondary | ICD-10-CM | POA: Diagnosis not present

## 2021-04-30 DIAGNOSIS — D573 Sickle-cell trait: Secondary | ICD-10-CM | POA: Diagnosis not present

## 2021-04-30 HISTORY — DX: Unspecified asthma, uncomplicated: J45.909

## 2021-04-30 HISTORY — DX: Palpitations: R00.2

## 2021-04-30 HISTORY — DX: Type 2 diabetes mellitus without complications: E11.9

## 2021-04-30 HISTORY — DX: Restless legs syndrome: G25.81

## 2021-04-30 HISTORY — DX: Presence of spectacles and contact lenses: Z97.3

## 2021-04-30 HISTORY — DX: Dermatitis, unspecified: L30.9

## 2021-04-30 HISTORY — DX: Long term (current) use of insulin: Z79.4

## 2021-04-30 HISTORY — DX: Hyperlipidemia, unspecified: E78.5

## 2021-04-30 HISTORY — DX: Obstructive sleep apnea (adult) (pediatric): G47.33

## 2021-04-30 HISTORY — PX: CARPAL TUNNEL RELEASE: SHX101

## 2021-04-30 HISTORY — DX: Sickle-cell trait: D57.3

## 2021-04-30 HISTORY — DX: Personal history of other infectious and parasitic diseases: Z86.19

## 2021-04-30 HISTORY — DX: Gastro-esophageal reflux disease without esophagitis: K21.9

## 2021-04-30 HISTORY — DX: Carpal tunnel syndrome, bilateral upper limbs: G56.03

## 2021-04-30 LAB — POCT I-STAT, CHEM 8
BUN: 16 mg/dL (ref 6–20)
Calcium, Ion: 1.14 mmol/L — ABNORMAL LOW (ref 1.15–1.40)
Chloride: 103 mmol/L (ref 98–111)
Creatinine, Ser: 0.6 mg/dL (ref 0.44–1.00)
Glucose, Bld: 108 mg/dL — ABNORMAL HIGH (ref 70–99)
HCT: 36 % (ref 36.0–46.0)
Hemoglobin: 12.2 g/dL (ref 12.0–15.0)
Potassium: 4.6 mmol/L (ref 3.5–5.1)
Sodium: 139 mmol/L (ref 135–145)
TCO2: 30 mmol/L (ref 22–32)

## 2021-04-30 LAB — POCT PREGNANCY, URINE: Preg Test, Ur: NEGATIVE

## 2021-04-30 LAB — GLUCOSE, CAPILLARY: Glucose-Capillary: 82 mg/dL (ref 70–99)

## 2021-04-30 SURGERY — CARPAL TUNNEL RELEASE
Anesthesia: Monitor Anesthesia Care | Laterality: Right

## 2021-04-30 MED ORDER — BUPIVACAINE HCL (PF) 0.5 % IJ SOLN
INTRAMUSCULAR | Status: DC | PRN
  Administered 2021-04-30: 5 mL

## 2021-04-30 MED ORDER — MIDAZOLAM HCL 2 MG/2ML IJ SOLN
INTRAMUSCULAR | Status: AC
  Filled 2021-04-30: qty 2

## 2021-04-30 MED ORDER — ACETAMINOPHEN 325 MG PO TABS: 325.0000 mg | ORAL_TABLET | ORAL | Status: DC | PRN

## 2021-04-30 MED ORDER — BACITRACIN ZINC 500 UNIT/GM EX OINT
TOPICAL_OINTMENT | CUTANEOUS | Status: DC | PRN
  Administered 2021-04-30: 1 via TOPICAL

## 2021-04-30 MED ORDER — KETOROLAC TROMETHAMINE 30 MG/ML IJ SOLN
INTRAMUSCULAR | Status: AC
  Filled 2021-04-30: qty 1

## 2021-04-30 MED ORDER — KETAMINE HCL 10 MG/ML IJ SOLN
INTRAMUSCULAR | Status: DC | PRN
  Administered 2021-04-30: 20 mg via INTRAVENOUS

## 2021-04-30 MED ORDER — LIDOCAINE HCL (PF) 1 % IJ SOLN
INTRAMUSCULAR | Status: DC | PRN
  Administered 2021-04-30: 5 mL

## 2021-04-30 MED ORDER — 0.9 % SODIUM CHLORIDE (POUR BTL) OPTIME
TOPICAL | Status: DC | PRN
  Administered 2021-04-30: 500 mL

## 2021-04-30 MED ORDER — OXYCODONE HCL 5 MG/5ML PO SOLN: 5.0000 mg | Freq: Once | ORAL | Status: DC | PRN

## 2021-04-30 MED ORDER — FENTANYL CITRATE (PF) 100 MCG/2ML IJ SOLN: 25.0000 ug | INTRAMUSCULAR | Status: DC | PRN

## 2021-04-30 MED ORDER — ACETAMINOPHEN 10 MG/ML IV SOLN: 1000.0000 mg | Freq: Once | INTRAVENOUS | Status: DC | PRN

## 2021-04-30 MED ORDER — HYDROCODONE-ACETAMINOPHEN 5-325 MG PO TABS: 1.0000 | ORAL_TABLET | Freq: Four times a day (QID) | ORAL | 0 refills | Status: DC | PRN

## 2021-04-30 MED ORDER — LACTATED RINGERS IV SOLN: INTRAVENOUS | Status: DC

## 2021-04-30 MED ORDER — ACETAMINOPHEN 160 MG/5ML PO SOLN: 325.0000 mg | ORAL | Status: DC | PRN

## 2021-04-30 MED ORDER — OXYCODONE HCL 5 MG PO TABS: 5.0000 mg | ORAL_TABLET | Freq: Once | ORAL | Status: DC | PRN

## 2021-04-30 MED ORDER — KETOROLAC TROMETHAMINE 30 MG/ML IJ SOLN
INTRAMUSCULAR | Status: DC | PRN
  Administered 2021-04-30: 30 mg via INTRAVENOUS

## 2021-04-30 MED ORDER — OXYCODONE-ACETAMINOPHEN 5-325 MG PO TABS: 1.0000 | ORAL_TABLET | Freq: Four times a day (QID) | ORAL | 0 refills | Status: AC | PRN

## 2021-04-30 MED ORDER — MIDAZOLAM HCL 5 MG/5ML IJ SOLN
INTRAMUSCULAR | Status: DC | PRN
  Administered 2021-04-30: 2 mg via INTRAVENOUS

## 2021-04-30 MED ORDER — GLYCOPYRROLATE PF 0.2 MG/ML IJ SOSY
PREFILLED_SYRINGE | INTRAMUSCULAR | Status: AC
  Filled 2021-04-30: qty 1

## 2021-04-30 MED ORDER — ONDANSETRON HCL 4 MG/2ML IJ SOLN
INTRAMUSCULAR | Status: DC | PRN
Start: 2021-04-30 — End: 2021-04-30
  Administered 2021-04-30: 4 mg via INTRAVENOUS

## 2021-04-30 MED ORDER — KETAMINE HCL 50 MG/5ML IJ SOSY
PREFILLED_SYRINGE | INTRAMUSCULAR | Status: AC
  Filled 2021-04-30: qty 5

## 2021-04-30 MED ORDER — AMISULPRIDE (ANTIEMETIC) 5 MG/2ML IV SOLN: 10.0000 mg | Freq: Once | INTRAVENOUS | Status: DC | PRN

## 2021-04-30 MED ORDER — DEXAMETHASONE SODIUM PHOSPHATE 4 MG/ML IJ SOLN
INTRAMUSCULAR | Status: DC | PRN
  Administered 2021-04-30: 5 mg via INTRAVENOUS

## 2021-04-30 SURGICAL SUPPLY — 28 items
BLADE SURG 15 STRL LF DISP TIS (BLADE) ×1 IMPLANT
BLADE SURG 15 STRL SS (BLADE) ×2
BNDG CMPR 9X4 STRL LF SNTH (GAUZE/BANDAGES/DRESSINGS) ×1
BNDG ELASTIC 4X5.8 VLCR STR LF (GAUZE/BANDAGES/DRESSINGS) ×2 IMPLANT
BNDG ESMARK 4X9 LF (GAUZE/BANDAGES/DRESSINGS) ×2 IMPLANT
COVER BACK TABLE 60X90IN (DRAPES) ×2 IMPLANT
CUFF TOURN SGL QUICK 18X4 (TOURNIQUET CUFF) ×2 IMPLANT
DRAPE EXTREMITY T 121X128X90 (DISPOSABLE) ×2 IMPLANT
DRSG EMULSION OIL 3X3 NADH (GAUZE/BANDAGES/DRESSINGS) ×2 IMPLANT
GAUZE 4X4 16PLY ~~LOC~~+RFID DBL (SPONGE) ×2 IMPLANT
GAUZE SPONGE 4X4 12PLY STRL (GAUZE/BANDAGES/DRESSINGS) ×2 IMPLANT
GAUZE SPONGE 4X4 12PLY STRL LF (GAUZE/BANDAGES/DRESSINGS) ×1 IMPLANT
GLOVE SURG UNDER POLY LF SZ7.5 (GLOVE) ×2 IMPLANT
GOWN STRL REUS W/TWL LRG LVL3 (GOWN DISPOSABLE) ×2 IMPLANT
HIBICLENS CHG 4% 4OZ BTL (MISCELLANEOUS) ×2 IMPLANT
KIT TURNOVER CYSTO (KITS) ×2 IMPLANT
KNIFE CARPAL TUNNEL (BLADE) ×2 IMPLANT
NEEDLE HYPO 22GX1.5 SAFETY (NEEDLE) ×2 IMPLANT
NS IRRIG 500ML POUR BTL (IV SOLUTION) ×2 IMPLANT
PACK BASIN DAY SURGERY FS (CUSTOM PROCEDURE TRAY) ×2 IMPLANT
PAD CAST 4YDX4 CTTN HI CHSV (CAST SUPPLIES) ×1 IMPLANT
PADDING CAST COTTON 4X4 STRL (CAST SUPPLIES) ×2
SUT ETHILON 4 0 PS 2 18 (SUTURE) ×2 IMPLANT
SYR 10ML LL (SYRINGE) ×2 IMPLANT
SYR BULB EAR ULCER 3OZ GRN STR (SYRINGE) ×2 IMPLANT
TOWEL OR 17X26 10 PK STRL BLUE (TOWEL DISPOSABLE) ×2 IMPLANT
TRAY DSU PREP LF (CUSTOM PROCEDURE TRAY) ×2 IMPLANT
UNDERPAD 30X36 HEAVY ABSORB (UNDERPADS AND DIAPERS) ×2 IMPLANT

## 2021-04-30 NOTE — H&P (Signed)
Preoperative History & Physical Exam ? ?Surgeon: Angela Ovens, MD ? ?Diagnosis: Right Carpal Tunnel Syndrome ? ?Planned Procedure: Procedure(s) (LRB): ?CARPAL TUNNEL RELEASE (Right) ? ?History of Present Illness:   ?Patient is a 56 y.o. female with symptoms consistent with Right Carpal Tunnel Syndrome who presents for surgical intervention. The risks, benefits and alternatives of surgical intervention were discussed and informed consent was obtained prior to surgery. ? ?Past Medical History:  ?Past Medical History:  ?Diagnosis Date  ? Anxiety   ? Asthma   ? followed by pcp---  (04-27-2021  pt stated uses advair/ rescue inhaler as needed, last used 01/ 2021)  ? Carpal tunnel syndrome on both sides   ? Eczema   ? GERD (gastroesophageal reflux disease)   ? History of chlamydia   ? History of COVID-19 03/2019  ? per pt mild to moderate symptoms that resolved  ? History of gonorrhea   ? History of herpes genitalis   ? Hyperlipidemia   ? Hypertension   ? Intermittent palpitations   ? per pt told from anxiety,  takes lexapro;  previously seen by cardiology-- dr Angela Mitchell, per lov note in epic 11-27-2018 work-up done , echo and nuclear stress test unremarkable and monitor show SR/ ST  w/ occasional PAC/ PVC (lov note in epic 11-27-2018)  ? OSA on CPAP   ? followed by Angela Mitchell;  study in epic 07-18-2019  severe osa  ? RLS (restless legs syndrome)   ? Sickle cell trait (HCC)   ? Type 2 diabetes mellitus treated with insulin (HCC)   ? followed by pcp;  (04-27-2021  stated check blood sugar with Angela Mitchell multiple times daily,  fasting sugar--- 120s)  ? Wears glasses   ? ? ?Past Surgical History:  ?Past Surgical History:  ?Procedure Laterality Date  ? CESAREAN SECTION  12/27/2003  ? @[facility]   ? DILATION AND CURETTAGE OF UTERUS  2013  ? missed ab  ? WISDOM TOOTH EXTRACTION    ? ? ?Medications:  ?Prior to Admission medications   ?Medication Sig Start Date End Date Taking? Authorizing Provider  ?albuterol (VENTOLIN HFA) 108 (90 Base)  MCG/ACT inhaler Inhale into the lungs every 4 (four) hours as needed. 12/08/19  Yes [provider]  ?Ascorbic Acid (VITAMIN C PO) Take by mouth daily.   Yes [provider]  ?aspirin EC 81 MG tablet Take 81 mg by mouth daily.   Yes [provider]  ?azelastine (ASTELIN) 0.1 % nasal spray Place 2 sprays into both nostrils 2 (two) times daily as needed.  06/10/18  Yes [provider]  ?azithromycin (ZITHROMAX) 500 MG tablet Take 500 mg by mouth daily. Take 500mg  day 1, then 250 mg daily for five days   Yes [provider]  ?benzonatate (TESSALON) 100 MG capsule Take by mouth 3 (three) times daily as needed for cough.   Yes [provider]  ?escitalopram (LEXAPRO) 5 MG tablet Take 3 tablets by mouth daily.   Yes [provider]  ?hydrochlorothiazide (HYDRODIURIL) 25 MG tablet Take 12.5 mg by mouth daily. 11/01/18  Yes [provider]  ?Insulin Glargine (BASAGLAR KWIKPEN) 100 UNIT/ML Inject 58 Units into the skin daily. 05/30/19  Yes [provider]  ?metFORMIN (GLUCOPHAGE-XR) 500 MG 24 hr tablet Take 500 mg by mouth at bedtime. 10/09/19  Yes [provider]  ?montelukast (SINGULAIR) 10 MG tablet Take 10 mg by mouth daily as needed. 05/19/19  Yes [provider]  ?OZEMPIC, 1 MG/DOSE, 2 MG/1.5ML SOPN Inject  1 mg into the skin once a week. Sunday's 05/29/19  Yes [provider]  ?rOPINIRole (REQUIP) 0.25 MG tablet Take 0.5 mg by mouth at bedtime as needed.   Yes [provider]  ?rosuvastatin (CRESTOR) 20 MG tablet Take 20 mg by mouth daily. 01/02/20  Yes [provider]  ?VITAMIN D, CHOLECALCIFEROL, PO Take 5,000 Units by mouth daily.   Yes [provider]  ?Fluticasone-Salmeterol (ADVAIR) 500-50 MCG/DOSE AEPB Inhale 1 puff into the lungs 2 (two) times daily as needed.    [provider]  ?linagliptin (TRADJENTA) 5 MG TABS tablet Take 5 mg by mouth daily. ?Patient not taking: Reported on  04/27/2021    [provider]  ? ? ?Allergies:  Patient has no known allergies. ? ?Review of Systems: Negative except per HPI. ? ?Physical Exam: ?Alert and oriented, NAD ?Head and neck: no masses, normal alignment ?CV: pulse intact ?Pulm: no increased work of breathing, respirations even and unlabored ?Abdomen: non-distended ?Extremities: extremities warm and well perfused ? ?LABS: ?Recent Results (from the past 2160 hour(s))  ?SARS Coronavirus 2 (TAT 6-24 hrs)     Status: None  ? Collection Time: 04/29/21 12:00 AM  ?Result Value Ref Range  ? SARS Coronavirus 2 RESULT: NEGATIVE   ?  Comment: RESULT: NEGATIVESARS-CoV-2 INTERPRETATION:A NEGATIVE  test result means that SARS-CoV-2 RNA was not present in the specimen above the limit of detection of this test. This does not preclude a possible SARS-CoV-2 infection and should not be used as the  ?sole basis for patient management decisions. Negative results must be combined with clinical observations, patient history, and epidemiological information. Optimum specimen types and timing for peak viral levels during infections caused by SARS-CoV-2  ?have not been determined. Collection of multiple specimens or types of specimens may be necessary to detect virus. Improper specimen collection and handling, sequence variability under primers/probes, or organism present below the limit of detection may  ?lead to false negative results. Positive and negative predictive values of testing are highly dependent on prevalence. False negative test results are more likely when prevalence of disease is high.The expected result is NEGATIVE.Fact S heet for  ?Healthcare Providers: [employer] Sheet for Patients: https://po[employer].org/ Reference Range - Negative ?  ?Pregnancy, urine POC     Status: None  ? Collection Time: 04/30/21  5:53 AM  ?Result Value Ref Range  ? Preg Test, Ur NEGATIVE NEGATIVE  ?  Comment:        ?THE  SENSITIVITY OF THIS ?METHODOLOGY IS >24 mIU/mL ?  ?I-STAT, chem 8     Status: Abnormal  ? Collection Time: 04/30/21  6:30 AM  ?Result Value Ref Range  ? Sodium 139 135 - 145 mmol/L  ? Potassium 4.6 3.5 - 5.1 mmol/L  ? Chloride 103 98 - 111 mmol/L  ? BUN 16 6 - 20 mg/dL  ? Creatinine, Ser 0.60 0.44 - 1.00 mg/dL  ? Glucose, Bld 108 (H) 70 - 99 mg/dL  ?  Comment: Glucose reference range applies only to samples taken after fasting for at least 8 hours.  ? Calcium, Ion 1.14 (L) 1.15 - 1.40 mmol/L  ? TCO2 30 22 - 32 mmol/L  ? Hemoglobin 12.2 12.0 - 15.0 g/dL  ? HCT 36.0 36.0 - 46.0 %  ?  ? ?Complete History and Physical exam available in the office notes ? ?Angela Mitchell ? ?

## 2021-04-30 NOTE — Anesthesia Preprocedure Evaluation (Addendum)
Anesthesia Evaluation  ?Patient identified by MRN, date of birth, ID band ?Patient awake ? ? ? ?Reviewed: ?Allergy & Precautions, NPO status , Patient's Chart, lab work & pertinent test results ? ?Airway ?Mallampati: I ? ?TM Distance: >3 FB ?Neck ROM: Full ? ? ? Dental ? ?(+) Teeth Intact, Dental Advisory Given ?  ?Pulmonary ?asthma , sleep apnea and Continuous Positive Airway Pressure Ventilation ,  ?  ?+ rhonchi ? ? ? ? ? ? ? Cardiovascular ?hypertension,  ?Rhythm:Regular Rate:Normal ? ? ?  ?Neuro/Psych ?Anxiety   ? GI/Hepatic ?Neg liver ROS, GERD  ,  ?Endo/Other  ?diabetes, Type 2 ? Renal/GU ?negative Renal ROS  ? ?  ?Musculoskeletal ?negative musculoskeletal ROS ?(+)  ? Abdominal ?Normal abdominal exam  (+)   ?Peds ? Hematology ? ?(+) Blood dyscrasia, Sickle cell trait ,   ?Anesthesia Other Findings ? ? Reproductive/Obstetrics ? ?  ? ? ? ? ? ? ? ? ? ? ? ? ? ?  ?  ? ? ? ? ? ? ? ?Anesthesia Physical ?Anesthesia Plan ? ?ASA: 2 ? ?Anesthesia Plan: MAC  ? ?Post-op Pain Management:   ? ?Induction: Intravenous ? ?PONV Risk Score and Plan: 3 and Ondansetron, Dexamethasone, Propofol infusion and Midazolam ? ?Airway Management Planned: Natural Airway and Simple Face Mask ? ?Additional Equipment: None ? ?Intra-op Plan:  ? ?Post-operative Plan:  ? ?Informed Consent: I have reviewed the patients History and Physical, chart, labs and discussed the procedure including the risks, benefits and alternatives for the proposed anesthesia with the patient or authorized representative who has indicated his/her understanding and acceptance.  ? ? ? ? ? ?Plan Discussed with: CRNA ? ?Anesthesia Plan Comments:   ? ? ? ? ? ?Anesthesia Quick Evaluation ? ?

## 2021-04-30 NOTE — Discharge Instructions (Addendum)
?Orthopaedic Hand Surgery Discharge Instructions ? ?WEIGHT BEARING STATUS: Non weight bearing on operative extremity ? ?INCISION CARE: Keep dressing over your incision clean and dry until 5 days after surgery. You may shower by placing a waterproof covering over your dressing. Once dressing is removed, you may allow water to run over the incision and then place Band-Aids over incision. Do not scrub your incision or apply creams/lotions. Do not submerge your incision or swim for 3 weeks after surgery. Contact your surgeon or primary care doctor if you develop redness or drainage from your incision.  ? ?PAIN CONTROL: First line medications for post operative pain control are Tylenol (acetaminophen) and Motrin (ibuprofen) if you are able to take these medications. If you have been prescribed a medication these can be taken as breakthrough pain medications. Please note that some narcotic pain medication has acetaminophen added and you should never consume more than 4,000mg  of acetaminophen in 24-hour period. Please note that if you are given Toradol (ketorolac) you should not take similar medications such as ibuprofen or naproxen. ? ?DISCHARGE MEDICATIONS: If you have been prescribed medication it was sent electronically to your pharmacy. No changes have been made to your home medications. ? ?ICE/ELEVATION: Ice and elevate your injured extremity as needed. Avoid direct contact of ice with skin.  ? ?BANDAGE FEELS TOO TIGHT: If your bandage feels too tight, first make sure you are elevating your fingers as much as possible. The outer layer of the bandage can be unwrapped and reapplied more loosely. If no improvement, you may carefully cut the inner layer longitudinally until the pressure has resolved and then rewrap the outer layer. If you are not comfortable with these instructions, please call the office and the bandage can be changed for you.  ? ?FOLLOW UP: You will be called after surgery with an appointment date and  time, however if you have not received a phone call within 3 days, please call during regular office hours at [phone] to schedule a post operative appointment. ? ?Please Seek Medical Attention if: ?Call MD for: pain or pressure in chest, jaw, arm, back, neck  ?Call MD for: temperature greater than 101 F for more than 24 hrs ?Call MD for: difficulty breathing ?Call MD for: incision redness, bleeding, drainage  ?Call MD for: palpitations or feeling that the heart is racing  ?Call MD for: increased swelling in arm, leg, ankle, or abdomen  ?Call MD for: lightheadedness, dizziness, fainting ?Call 911 or go to ER for any medical emergency if you are not able to get in touch with your doctor ? ? ?J. Sable Feil, MD ?Orthopaedic Hand Surgeon ?[facility] ?Office number: [phone] ?[address] ?[place], [place] [address] ? ?No ibuprofen, Advil, Aleve, Motrin, ketorolac, meloxicam, naproxen, or other NSAIDS until after 2 pm today if needed. ? ?Post Anesthesia Home Care Instructions ? ?Activity: ?Get plenty of rest for the remainder of the day. A responsible individual must stay with you for 24 hours following the procedure.  ?For the next 24 hours, DO NOT: ?-Drive a car ?-Paediatric nurse ?-Drink alcoholic beverages ?-Take any medication unless instructed by your physician ?-Make any legal decisions or sign important papers. ? ?Meals: ?Start with liquid foods such as gelatin or soup. Progress to regular foods as tolerated. Avoid greasy, spicy, heavy foods. If nausea and/or vomiting occur, drink only clear liquids until the nausea and/or vomiting subsides. Call your physician if vomiting continues. ? ?Special Instructions/Symptoms: ?Your throat may feel dry or sore from the  anesthesia or the breathing tube placed in your throat during surgery. If this causes discomfort, gargle with warm salt water. The discomfort should disappear within 24 hours. ? ?   ? ?

## 2021-04-30 NOTE — Anesthesia Postprocedure Evaluation (Signed)
Anesthesia Post Note ? ?Patient: Angela Mitchell ? ?Procedure(s) Performed: CARPAL TUNNEL RELEASE (Right) ? ?  ? ?Patient location during evaluation: PACU ?Anesthesia Type: MAC ?Level of consciousness: awake and alert ?Pain management: pain level controlled ?Vital Signs Assessment: post-procedure vital signs reviewed and stable ?Respiratory status: spontaneous breathing, nonlabored ventilation, respiratory function stable and patient connected to nasal cannula oxygen ?Cardiovascular status: stable and blood pressure returned to baseline ?Postop Assessment: no apparent nausea or vomiting ?Anesthetic complications: no ? ? ?No notable events documented. ? ?Last Vitals:  ?Vitals:  ? 04/30/21 0800 04/30/21 0845  ?BP: (!) 114/59 (!) 101/53  ?Pulse: 68 66  ?Resp: 14 16  ?Temp: 36.7 ?C (!) 36.4 ?C  ?SpO2: 97% 97%  ?  ?Last Pain:  ?Vitals:  ? 04/30/21 0845  ?TempSrc:   ?PainSc: 0-No pain  ? ? ?  ?  ?  ?  ?  ?  ? ?Effie Berkshire ? ? ? ? ?

## 2021-04-30 NOTE — Transfer of Care (Signed)
Immediate Anesthesia Transfer of Care Note ? ?Patient: Angela Mitchell ? ?Procedure(s) Performed: CARPAL TUNNEL RELEASE (Right) ? ?Patient Location: PACU ? ?Anesthesia Type:MAC ? ?Level of Consciousness: awake, alert  and oriented ? ?Airway & Oxygen Therapy: Patient Spontanous Breathing and Patient connected to nasal cannula oxygen ? ?Post-op Assessment: Report given to RN and Post -op Vital signs reviewed and stable ? ?Post vital signs: Reviewed and stable ? ?Last Vitals:  ?Vitals Value Taken Time  ?BP    ?Temp    ?Pulse    ?Resp    ?SpO2    ? ? ?Last Pain:  ?Vitals:  ? 04/30/21 0546  ?TempSrc: Oral  ?PainSc: 0-No pain  ?   ? ?Patients Stated Pain Goal: 2 (04/30/21 0546) ? ?Complications: No notable events documented. ?

## 2021-04-30 NOTE — Op Note (Signed)
OPERATIVE NOTE ? ?DATE OF PROCEDURE: 04/30/2021 ? ?SURGEON: Izell Morrison, MD ? ?PREOPERATIVE DIAGNOSIS: Right Carpal Tunnel Syndrome ? ?POSTOPERATIVE DIAGNOSIS: Same ? ?NAME OF PROCEDURE: Right Carpal Tunnel Release ? ?ANESTHESIA: Local + MAC ? ?SKIN PREPARATION: Hibiclens ? ?ESTIMATED BLOOD LOSS: Minimal ? ?IMPLANTS: none ? ?INDICATIONS:  Angela Mitchell is a 56 y.o. female who presents with right carpal tunnel syndrome, refractory to nonoperative treatment. The patient has decided to proceed with surgical intervention.  Risks, benefits and alternatives of operative management were discussed including, but not limited to, risks of anesthesia complications, infection, pain, persistent symptoms, stiffness, need for future surgery.  The patient understands, agrees and elects to proceed with surgery.   ? ?DESCRIPTION OF PROCEDURE: The patient was placed in the usual supine position and the right upper extremity was prepped and draped in normal sterile fashion.  After local block anesthetic to the right hand and wrist, a standard 1.5 cm incision was made in the midpalm.  This was carried down through the subcutaneous tissues and palmar fascia to the transverse carpal ligament.  The distal one-half of the transverse carpal ligament was incised longitudinally under direct vision using a 15 blade.  The carpal tunnel release guide was then placed under direct vision on the transverse carpal ligament and slid proximally.  The guide was palpated into appropriate alignment longitudinally.  Contact with the transverse carpal ligament was maintained throughout passing.  The blade was engaged into the guide and the remaining portion of the transverse carpal ligament released completely.  No other abnormalities were noted.  The wound was copiously irrigated and the skin closed using horizontal mattress 4-0 nylon sutures.  A light bulky dressing was placed.  The patient tolerated the procedure well and returned to the recovery room in  stable condition.  I was present for the entire surgical procedure. ? ? ?Matt Holmes, MD ? ?

## 2021-05-01 ENCOUNTER — Encounter (HOSPITAL_BASED_OUTPATIENT_CLINIC_OR_DEPARTMENT_OTHER): Payer: Self-pay | Admitting: Orthopedic Surgery

## 2021-10-01 ENCOUNTER — Ambulatory Visit
Admission: RE | Admit: 2021-10-01 | Discharge: 2021-10-01 | Disposition: A | Discharge status: Home / Self Care | Payer: BLUE CROSS/BLUE SHIELD | Source: Ambulatory Visit | Attending: Family Medicine | Admitting: Family Medicine

## 2021-10-01 ENCOUNTER — Other Ambulatory Visit: Payer: Self-pay | Admitting: Family Medicine

## 2021-10-01 DIAGNOSIS — M1990 Unspecified osteoarthritis, unspecified site: Secondary | ICD-10-CM

## 2021-12-07 DIAGNOSIS — E1165 Type 2 diabetes mellitus with hyperglycemia: Secondary | ICD-10-CM | POA: Diagnosis not present

## 2021-12-07 DIAGNOSIS — M170 Bilateral primary osteoarthritis of knee: Secondary | ICD-10-CM | POA: Diagnosis not present

## 2021-12-07 DIAGNOSIS — G4700 Insomnia, unspecified: Secondary | ICD-10-CM | POA: Diagnosis not present

## 2021-12-07 DIAGNOSIS — F411 Generalized anxiety disorder: Secondary | ICD-10-CM | POA: Diagnosis not present

## 2021-12-16 DIAGNOSIS — M170 Bilateral primary osteoarthritis of knee: Secondary | ICD-10-CM | POA: Diagnosis not present

## 2021-12-23 DIAGNOSIS — M170 Bilateral primary osteoarthritis of knee: Secondary | ICD-10-CM | POA: Diagnosis not present

## 2021-12-30 DIAGNOSIS — M170 Bilateral primary osteoarthritis of knee: Secondary | ICD-10-CM | POA: Diagnosis not present

## 2022-01-05 NOTE — Progress Notes (Unsigned)
PATIENT: Angela Mitchell DOB: 1966/01/03  REASON FOR VISIT: follow up HISTORY FROM: patient   HISTORY OF PRESENT ILLNESS: Today 01/06/22:  Angela Mitchell is a 56 year old female with a history of obstructive sleep apnea on CPAP.  She returns today for follow-up.  Her download is below.  She reports that the machine is working well for her.  She denies any new symptoms.  States that she feels well rested during the day.  Not requiring naps or falling asleep unexpectedly.  She states lately she has had a head cold.  For that reason she has not been able to use his CPAP consistently due to nasal congestion.   01/05/21: Angela Mitchell is a 56 y.o. female with a history of OSA on CPAP. She is here today for a follow up. Her download below She has no complaints with her machine at this time. She reports that it helps with her sleep and she has decreased headaches. Her download below suggest that she is wearing her machine for 21 of the 30 days and for >4 hours for a compliance of 70%. She has a good treatment of AHI 1.8 with and auto pressure setting. Patient endorses that her sleep schedule has not been consistent since losing her mother in September. Usually wakes up around 230AM watches tablet until she falls back asleep. Tried melationin for 2 weeks- reports it helped some. She has no questions or concerns at this time.     HISTORY  01/02/20 Angela Mitchell is a 56 y.o. female patient , and seen here in her first Re visit after sleep study_ 01-02-2020.  There were 1 to the patient has been undergoing a home sleep evaluation as of 18 Jul 2019 which was part of her reevaluation and her home sleep test recorded a severe degree of sleep apnea with an AHI of 72/h additionally loud snoring indicated by RDI she also experienced tachybradycardia and many many brief oxygen desaturations.  So she was placed on an auto titration CPAP and has been using it compliantly 85% of days and over 4 hours for 80% of those.   Her average usage time on days used is 7 hours 10 minutes at night the AutoSet has a minimum pressure of 5 and can go up to a maximum pressure of 16 cm water pressure she also has an EPR-expiratory pressure relief setting-of 2 cmH2O her residual AHI is 1.5/h which is a great resolution in comparison to her baseline.  95th percentile pressure is 15.9 cmH2O so she is right there where she needs to be she does have minor air leakage according to her download but she has experienced leakage at home that has actually woken her up because of the air escape and blew into her eyes.  She is using a FFM covering mask and small size which initially seemed to fit well but now has left a pressure mark of the bridge of the nose.  REVIEW OF SYSTEMS: Out of a complete 14 system review of symptoms, the patient complains only of the following symptoms, and all other reviewed systems are negative.   ESS 0  ALLERGIES: No Known Allergies  HOME MEDICATIONS: Outpatient Medications Prior to Visit  Medication Sig Dispense Refill   albuterol (VENTOLIN HFA) 108 (90 Base) MCG/ACT inhaler Inhale into the lungs every 4 (four) hours as needed.     Ascorbic Acid (VITAMIN C PO) Take by mouth daily.     aspirin EC 81 MG tablet Take 81  mg by mouth daily.     azelastine (ASTELIN) 0.1 % nasal spray Place 2 sprays into both nostrils 2 (two) times daily as needed.      azithromycin (ZITHROMAX) 500 MG tablet Take 500 mg by mouth daily. Take 500mg  day 1, then 250 mg daily for five days     benzonatate (TESSALON) 100 MG capsule Take by mouth 3 (three) times daily as needed for cough.     escitalopram (LEXAPRO) 5 MG tablet Take 3 tablets by mouth daily.     Fluticasone-Salmeterol (ADVAIR) 500-50 MCG/DOSE AEPB Inhale 1 puff into the lungs 2 (two) times daily as needed.     hydrochlorothiazide (HYDRODIURIL) 25 MG tablet Take 12.5 mg by mouth daily.     Insulin Glargine (BASAGLAR KWIKPEN) 100 UNIT/ML Inject 58 Units into the skin daily.      metFORMIN (GLUCOPHAGE-XR) 500 MG 24 hr tablet Take 500 mg by mouth at bedtime.     montelukast (SINGULAIR) 10 MG tablet Take 10 mg by mouth daily as needed.     OZEMPIC, 1 MG/DOSE, 2 MG/1.5ML SOPN Inject 1 mg into the skin once a week. Sunday's     rOPINIRole (REQUIP) 0.25 MG tablet Take 0.5 mg by mouth at bedtime as needed.     rosuvastatin (CRESTOR) 20 MG tablet Take 20 mg by mouth daily.     VITAMIN D, CHOLECALCIFEROL, PO Take 5,000 Units by mouth daily.     No facility-administered medications prior to visit.    PAST MEDICAL HISTORY: Past Medical History:  Diagnosis Date   Anxiety    Asthma    followed by pcp---  (04-27-2021  pt stated uses advair/ rescue inhaler as needed, last used 01/ 2021)   Carpal tunnel syndrome on both sides    Eczema    GERD (gastroesophageal reflux disease)    History of chlamydia    History of COVID-19 03/2019   per pt mild to moderate symptoms that resolved   History of gonorrhea    History of herpes genitalis    Hyperlipidemia    Hypertension    Intermittent palpitations    per pt told from anxiety,  takes lexapro;  previously seen by cardiology-- dr Arnell Sieving, per lov note in epic 11-27-2018 work-up done , echo and nuclear stress test unremarkable and monitor show SR/ ST  w/ occasional PAC/ PVC (lov note in epic 11-27-2018)   OSA on CPAP    followed by [facility];  study in epic 07-18-2019  severe osa   RLS (restless legs syndrome)    Sickle cell trait (HCC)    Type 2 diabetes mellitus treated with insulin (HCC)    followed by pcp;  (04-27-2021  stated check blood sugar with [name] multiple times daily,  fasting sugar--- 120s)   Wears glasses     PAST SURGICAL HISTORY: Past Surgical History:  Procedure Laterality Date   CARPAL TUNNEL RELEASE Right 04/30/2021   Procedure: CARPAL TUNNEL RELEASE;  Surgeon: Gomez Cleverly, MD;  Location: [facility] [place];  Service: Orthopedics;  Laterality: Right;  with local anesthesia   CESAREAN  SECTION  12/27/2003   @[facility]    DILATION AND CURETTAGE OF UTERUS  2013   missed ab   WISDOM TOOTH EXTRACTION      FAMILY HISTORY: Family History  Problem Relation Age of Onset   Diabetes Mother    Hypertension Father    Hyperlipidemia Father    Diabetes Father    Diabetes Maternal Grandmother    Sleep apnea Neg  Hx     SOCIAL HISTORY: Social History   Socioeconomic History   Marital status: Married    Spouse name: Not on file   Number of children: 2   Years of education: Not on file   Highest education level: Not on file  Occupational History   Not on file  Tobacco Use   Smoking status: Never   Smokeless tobacco: Never  Vaping Use   Vaping Use: Never used  Substance and Sexual Activity   Alcohol use: No   Drug use: Never   Sexual activity: Yes    Birth control/protection: None    Comment: premenopausal  Other Topics Concern   Not on file  Social History Narrative   Not on file   Social Determinants of Health   Financial Resource Strain: Not on file  Food Insecurity: Not on file  Transportation Needs: Not on file  Physical Activity: Not on file  Stress: Not on file  Social Connections: Not on file  Intimate Partner Violence: Not on file      PHYSICAL EXAM  There were no vitals filed for this visit.  There is no height or weight on file to calculate BMI.  Generalized: Well developed, in no acute distress  Chest: Lungs clear to auscultation bilaterally  Neurological examination  Mentation: Alert oriented to time, place, history taking. Follows all commands speech and language fluent Cranial nerve II-XII: Extraocular movements were full, visual field were full on confrontational test Head turning and shoulder shrug  were normal and symmetric. Motor: The motor testing reveals 5 over 5 strength of all 4 extremities. Good symmetric motor tone is noted throughout.  Sensory: Sensory testing is intact to soft touch on all 4 extremities. No evidence of  extinction is noted.  Gait and station: Gait is normal.    DIAGNOSTIC DATA (LABS, IMAGING, TESTING) - I reviewed patient records, labs, notes, testing and imaging myself where available.  Lab Results  Component Value Date   WBC 11.1 (H) 04/03/2011   HGB 12.2 04/30/2021   HCT 36.0 04/30/2021   MCV 80.5 04/03/2011   PLT 401 (H) 04/03/2011      Component Value Date/Time   NA 139 04/30/2021 0630   K 4.6 04/30/2021 0630   CL 103 04/30/2021 0630   CO2 26 05/13/2010 0813   GLUCOSE 108 (H) 04/30/2021 0630   BUN 16 04/30/2021 0630   CREATININE 0.60 04/30/2021 0630   CALCIUM 8.7 05/13/2010 0813   PROT 7.2 05/13/2010 0813   ALBUMIN 3.4 (L) 05/13/2010 0813   AST 22 05/13/2010 0813   ALT 19 05/13/2010 0813   ALKPHOS 46 05/13/2010 0813   BILITOT 0.3 05/13/2010 0813   GFRNONAA >60 05/13/2010 0813   GFRAA  05/13/2010 0813    >60        The eGFR has been calculated using the MDRD equation. This calculation has not been validated in all clinical situations. eGFR's persistently <60 mL/min signify possible Chronic Kidney Disease.      ASSESSMENT AND PLAN 56 y.o. year old female  has a past medical history of Anxiety, Asthma, Carpal tunnel syndrome on both sides, Eczema, GERD (gastroesophageal reflux disease), History of chlamydia, History of COVID-19 (03/2019), History of gonorrhea, History of herpes genitalis, Hyperlipidemia, Hypertension, Intermittent palpitations, OSA on CPAP, RLS (restless legs syndrome), Sickle cell trait (HCC), Type 2 diabetes mellitus treated with insulin (HCC), and Wears glasses. here with:  OSA on CPAP  - CPAP compliance good - Good treatment of AHI  -  Encourage patient to use CPAP nightly and > 4 hours each night   - F/U in 1 year or sooner if needed    Butch Penny, MSN, NP-C 01/06/2022, 2:35 PM [facility] [address], [address] [place], [place] [address] [phone]

## 2022-01-06 ENCOUNTER — Ambulatory Visit (INDEPENDENT_AMBULATORY_CARE_PROVIDER_SITE_OTHER): Payer: BLUE CROSS/BLUE SHIELD | Admitting: Adult Health

## 2022-01-06 ENCOUNTER — Encounter: Payer: Self-pay | Admitting: Adult Health

## 2022-01-06 VITALS — BP 114/60 | HR 71 | Ht 63.0 in | Wt 229.0 lb

## 2022-01-06 DIAGNOSIS — G4733 Obstructive sleep apnea (adult) (pediatric): Secondary | ICD-10-CM | POA: Diagnosis not present

## 2022-01-06 NOTE — Patient Instructions (Signed)
Continue using CPAP nightly and greater than 4 hours each night °If your symptoms worsen or you develop new symptoms please let us know.  ° °

## 2022-01-25 DIAGNOSIS — E1165 Type 2 diabetes mellitus with hyperglycemia: Secondary | ICD-10-CM | POA: Diagnosis not present

## 2022-01-25 DIAGNOSIS — E11319 Type 2 diabetes mellitus with unspecified diabetic retinopathy without macular edema: Secondary | ICD-10-CM | POA: Diagnosis not present

## 2022-01-25 DIAGNOSIS — E1121 Type 2 diabetes mellitus with diabetic nephropathy: Secondary | ICD-10-CM | POA: Diagnosis not present

## 2022-01-28 DIAGNOSIS — E119 Type 2 diabetes mellitus without complications: Secondary | ICD-10-CM | POA: Diagnosis not present

## 2022-02-05 DIAGNOSIS — J069 Acute upper respiratory infection, unspecified: Secondary | ICD-10-CM | POA: Diagnosis not present

## 2022-02-05 DIAGNOSIS — J209 Acute bronchitis, unspecified: Secondary | ICD-10-CM | POA: Diagnosis not present

## 2022-02-05 DIAGNOSIS — B9689 Other specified bacterial agents as the cause of diseases classified elsewhere: Secondary | ICD-10-CM | POA: Diagnosis not present

## 2022-02-05 DIAGNOSIS — J329 Chronic sinusitis, unspecified: Secondary | ICD-10-CM | POA: Diagnosis not present

## 2022-02-16 DIAGNOSIS — J069 Acute upper respiratory infection, unspecified: Secondary | ICD-10-CM | POA: Diagnosis not present

## 2022-02-16 DIAGNOSIS — B9689 Other specified bacterial agents as the cause of diseases classified elsewhere: Secondary | ICD-10-CM | POA: Diagnosis not present

## 2022-02-16 DIAGNOSIS — J329 Chronic sinusitis, unspecified: Secondary | ICD-10-CM | POA: Diagnosis not present

## 2022-02-16 DIAGNOSIS — J209 Acute bronchitis, unspecified: Secondary | ICD-10-CM | POA: Diagnosis not present

## 2022-02-18 DIAGNOSIS — J069 Acute upper respiratory infection, unspecified: Secondary | ICD-10-CM | POA: Diagnosis not present

## 2022-02-18 DIAGNOSIS — J4521 Mild intermittent asthma with (acute) exacerbation: Secondary | ICD-10-CM | POA: Diagnosis not present

## 2022-02-18 DIAGNOSIS — R0600 Dyspnea, unspecified: Secondary | ICD-10-CM | POA: Diagnosis not present

## 2022-02-18 DIAGNOSIS — J45909 Unspecified asthma, uncomplicated: Secondary | ICD-10-CM | POA: Diagnosis not present

## 2022-02-18 DIAGNOSIS — J209 Acute bronchitis, unspecified: Secondary | ICD-10-CM | POA: Diagnosis not present

## 2022-02-24 DIAGNOSIS — K219 Gastro-esophageal reflux disease without esophagitis: Secondary | ICD-10-CM | POA: Diagnosis not present

## 2022-02-25 ENCOUNTER — Other Ambulatory Visit: Payer: Self-pay | Admitting: Family Medicine

## 2022-02-25 DIAGNOSIS — Z1231 Encounter for screening mammogram for malignant neoplasm of breast: Secondary | ICD-10-CM

## 2022-03-08 DIAGNOSIS — I10 Essential (primary) hypertension: Secondary | ICD-10-CM | POA: Diagnosis not present

## 2022-03-08 DIAGNOSIS — E559 Vitamin D deficiency, unspecified: Secondary | ICD-10-CM | POA: Diagnosis not present

## 2022-03-08 DIAGNOSIS — K219 Gastro-esophageal reflux disease without esophagitis: Secondary | ICD-10-CM | POA: Diagnosis not present

## 2022-03-08 DIAGNOSIS — E782 Mixed hyperlipidemia: Secondary | ICD-10-CM | POA: Diagnosis not present

## 2022-04-20 ENCOUNTER — Ambulatory Visit
Admission: RE | Admit: 2022-04-20 | Discharge: 2022-04-20 | Disposition: A | Discharge status: Home / Self Care | Payer: BLUE CROSS/BLUE SHIELD | Source: Ambulatory Visit | Attending: Family Medicine | Admitting: Family Medicine

## 2022-04-20 DIAGNOSIS — Z1231 Encounter for screening mammogram for malignant neoplasm of breast: Secondary | ICD-10-CM

## 2022-04-21 DIAGNOSIS — J45909 Unspecified asthma, uncomplicated: Secondary | ICD-10-CM | POA: Diagnosis not present

## 2022-04-21 DIAGNOSIS — J329 Chronic sinusitis, unspecified: Secondary | ICD-10-CM | POA: Diagnosis not present

## 2022-04-21 DIAGNOSIS — J069 Acute upper respiratory infection, unspecified: Secondary | ICD-10-CM | POA: Diagnosis not present

## 2022-04-23 ENCOUNTER — Other Ambulatory Visit: Payer: Self-pay | Admitting: Family Medicine

## 2022-04-23 ENCOUNTER — Ambulatory Visit
Admission: RE | Admit: 2022-04-23 | Discharge: 2022-04-23 | Disposition: A | Discharge status: Home / Self Care | Payer: BLUE CROSS/BLUE SHIELD | Source: Ambulatory Visit | Attending: Family Medicine | Admitting: Family Medicine

## 2022-04-23 DIAGNOSIS — J069 Acute upper respiratory infection, unspecified: Secondary | ICD-10-CM

## 2022-04-26 DIAGNOSIS — I10 Essential (primary) hypertension: Secondary | ICD-10-CM | POA: Diagnosis not present

## 2022-04-26 DIAGNOSIS — E1165 Type 2 diabetes mellitus with hyperglycemia: Secondary | ICD-10-CM | POA: Diagnosis not present

## 2022-04-26 DIAGNOSIS — J309 Allergic rhinitis, unspecified: Secondary | ICD-10-CM | POA: Diagnosis not present

## 2022-06-21 DIAGNOSIS — E1165 Type 2 diabetes mellitus with hyperglycemia: Secondary | ICD-10-CM | POA: Diagnosis not present

## 2022-07-27 DIAGNOSIS — I10 Essential (primary) hypertension: Secondary | ICD-10-CM | POA: Diagnosis not present

## 2022-07-27 DIAGNOSIS — E1165 Type 2 diabetes mellitus with hyperglycemia: Secondary | ICD-10-CM | POA: Diagnosis not present

## 2022-07-27 DIAGNOSIS — Z6841 Body Mass Index (BMI) 40.0 and over, adult: Secondary | ICD-10-CM | POA: Diagnosis not present

## 2022-09-23 DIAGNOSIS — I10 Essential (primary) hypertension: Secondary | ICD-10-CM | POA: Diagnosis not present

## 2022-09-23 DIAGNOSIS — E1165 Type 2 diabetes mellitus with hyperglycemia: Secondary | ICD-10-CM | POA: Diagnosis not present

## 2022-10-22 DIAGNOSIS — U071 COVID-19: Secondary | ICD-10-CM | POA: Diagnosis not present

## 2022-11-10 ENCOUNTER — Other Ambulatory Visit: Payer: Self-pay

## 2022-11-10 ENCOUNTER — Emergency Department (HOSPITAL_COMMUNITY): Payer: BLUE CROSS/BLUE SHIELD

## 2022-11-10 ENCOUNTER — Emergency Department (HOSPITAL_COMMUNITY)
Admission: EM | Admit: 2022-11-10 | Discharge: 2022-11-10 | Disposition: A | Discharge status: Home / Self Care | Payer: BLUE CROSS/BLUE SHIELD | Attending: Emergency Medicine | Admitting: Emergency Medicine

## 2022-11-10 ENCOUNTER — Encounter (HOSPITAL_COMMUNITY): Payer: Self-pay

## 2022-11-10 DIAGNOSIS — M25512 Pain in left shoulder: Secondary | ICD-10-CM | POA: Diagnosis not present

## 2022-11-10 DIAGNOSIS — E119 Type 2 diabetes mellitus without complications: Secondary | ICD-10-CM | POA: Insufficient documentation

## 2022-11-10 DIAGNOSIS — M25511 Pain in right shoulder: Secondary | ICD-10-CM | POA: Insufficient documentation

## 2022-11-10 DIAGNOSIS — Z794 Long term (current) use of insulin: Secondary | ICD-10-CM | POA: Diagnosis not present

## 2022-11-10 DIAGNOSIS — S79911A Unspecified injury of right hip, initial encounter: Secondary | ICD-10-CM | POA: Diagnosis not present

## 2022-11-10 DIAGNOSIS — R079 Chest pain, unspecified: Secondary | ICD-10-CM | POA: Insufficient documentation

## 2022-11-10 DIAGNOSIS — I10 Essential (primary) hypertension: Secondary | ICD-10-CM | POA: Diagnosis not present

## 2022-11-10 DIAGNOSIS — M542 Cervicalgia: Secondary | ICD-10-CM | POA: Insufficient documentation

## 2022-11-10 DIAGNOSIS — Y92410 Unspecified street and highway as the place of occurrence of the external cause: Secondary | ICD-10-CM | POA: Diagnosis not present

## 2022-11-10 DIAGNOSIS — M25551 Pain in right hip: Secondary | ICD-10-CM | POA: Insufficient documentation

## 2022-11-10 DIAGNOSIS — Z8616 Personal history of COVID-19: Secondary | ICD-10-CM | POA: Insufficient documentation

## 2022-11-10 DIAGNOSIS — J45909 Unspecified asthma, uncomplicated: Secondary | ICD-10-CM | POA: Insufficient documentation

## 2022-11-10 DIAGNOSIS — Z79899 Other long term (current) drug therapy: Secondary | ICD-10-CM | POA: Insufficient documentation

## 2022-11-10 DIAGNOSIS — Z7982 Long term (current) use of aspirin: Secondary | ICD-10-CM | POA: Insufficient documentation

## 2022-11-10 DIAGNOSIS — S299XXA Unspecified injury of thorax, initial encounter: Secondary | ICD-10-CM | POA: Diagnosis not present

## 2022-11-10 DIAGNOSIS — R9389 Abnormal findings on diagnostic imaging of other specified body structures: Secondary | ICD-10-CM | POA: Diagnosis not present

## 2022-11-10 MED ORDER — METHOCARBAMOL 500 MG PO TABS: 500.0000 mg | ORAL_TABLET | Freq: Two times a day (BID) | ORAL | 0 refills | Status: AC

## 2022-11-10 NOTE — ED Notes (Signed)
Pt taken to Xray.
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
## 2022-11-10 NOTE — Discharge Instructions (Addendum)
Please take tylenol/ibuprofen/naproxen and muscle relaxants for pain. I recommend close follow-up with PCP for reevaluation.  Please do not hesitate to return to emergency department if worrisome signs symptoms we discussed become apparent.

## 2022-11-10 NOTE — ED Notes (Signed)
Pt provided with AVS.  Education complete; all questions answered.  Pt leaving ED in stable condition at this time, ambulatory with all belongings. 

## 2022-11-16 DIAGNOSIS — U071 COVID-19: Secondary | ICD-10-CM | POA: Diagnosis not present

## 2022-11-16 DIAGNOSIS — M25559 Pain in unspecified hip: Secondary | ICD-10-CM | POA: Diagnosis not present

## 2022-12-10 DIAGNOSIS — H93A2 Pulsatile tinnitus, left ear: Secondary | ICD-10-CM | POA: Diagnosis not present

## 2022-12-10 DIAGNOSIS — K219 Gastro-esophageal reflux disease without esophagitis: Secondary | ICD-10-CM | POA: Diagnosis not present

## 2022-12-10 DIAGNOSIS — H9192 Unspecified hearing loss, left ear: Secondary | ICD-10-CM | POA: Diagnosis not present

## 2022-12-17 DIAGNOSIS — M25551 Pain in right hip: Secondary | ICD-10-CM | POA: Diagnosis not present

## 2022-12-18 IMAGING — MG DIGITAL SCREENING BILAT W/ CAD
4 series · 4 of 4 positions shown · non-contrast
Comparison: Previous exam(s).

CLINICAL DATA: Screening.

EXAM:
DIGITAL SCREENING BILATERAL MAMMOGRAM WITH CAD
TECHNIQUE: Bilateral screening digital craniocaudal and mediolateral oblique
mammograms were obtained. The images were evaluated with
computer-aided detection.

[R MLO]
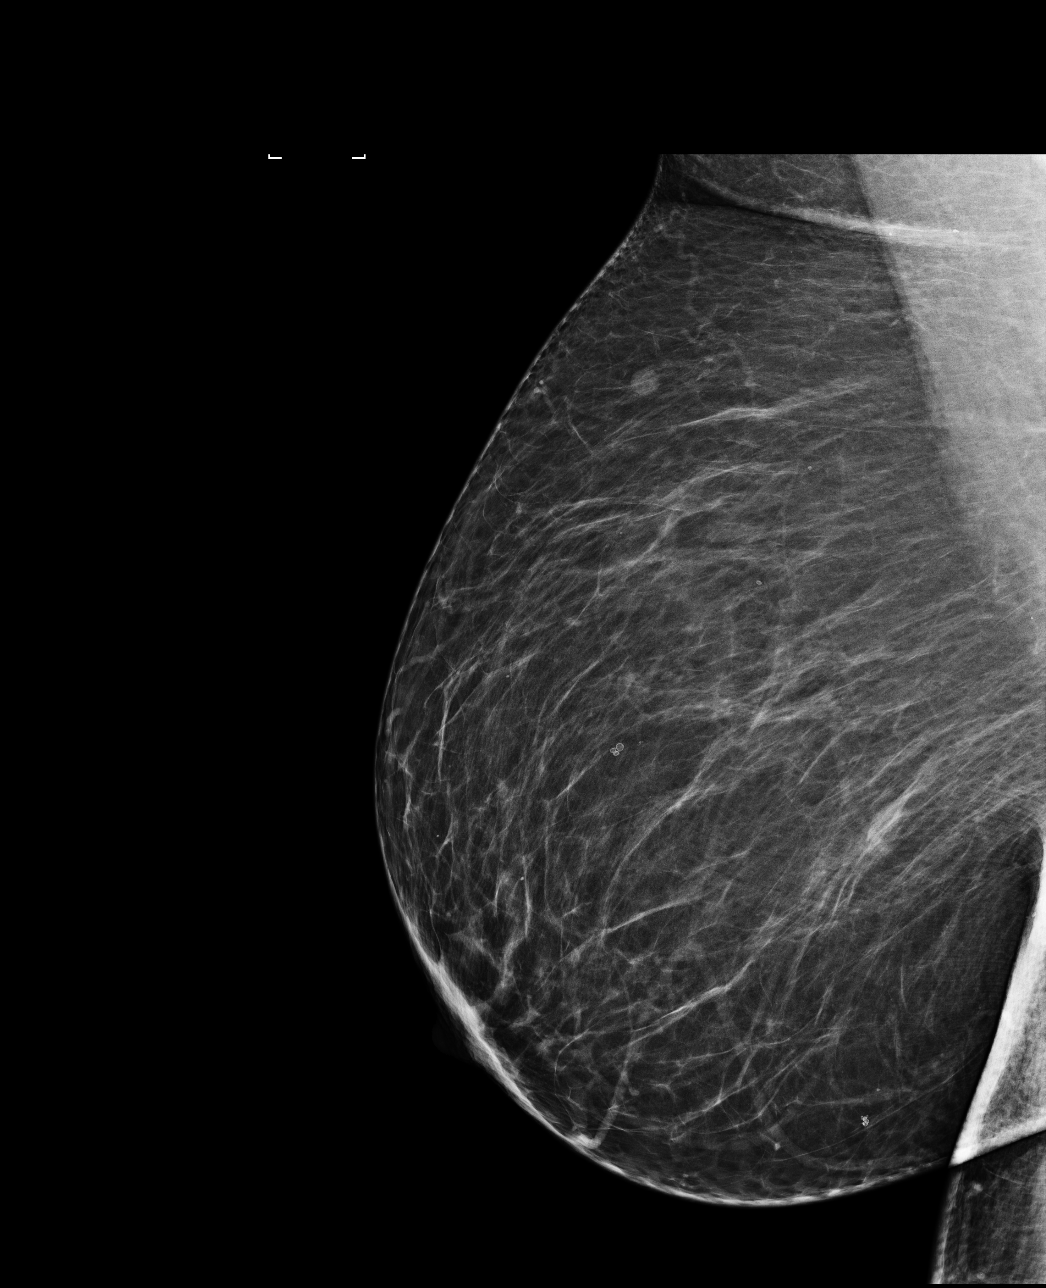

[R CC]
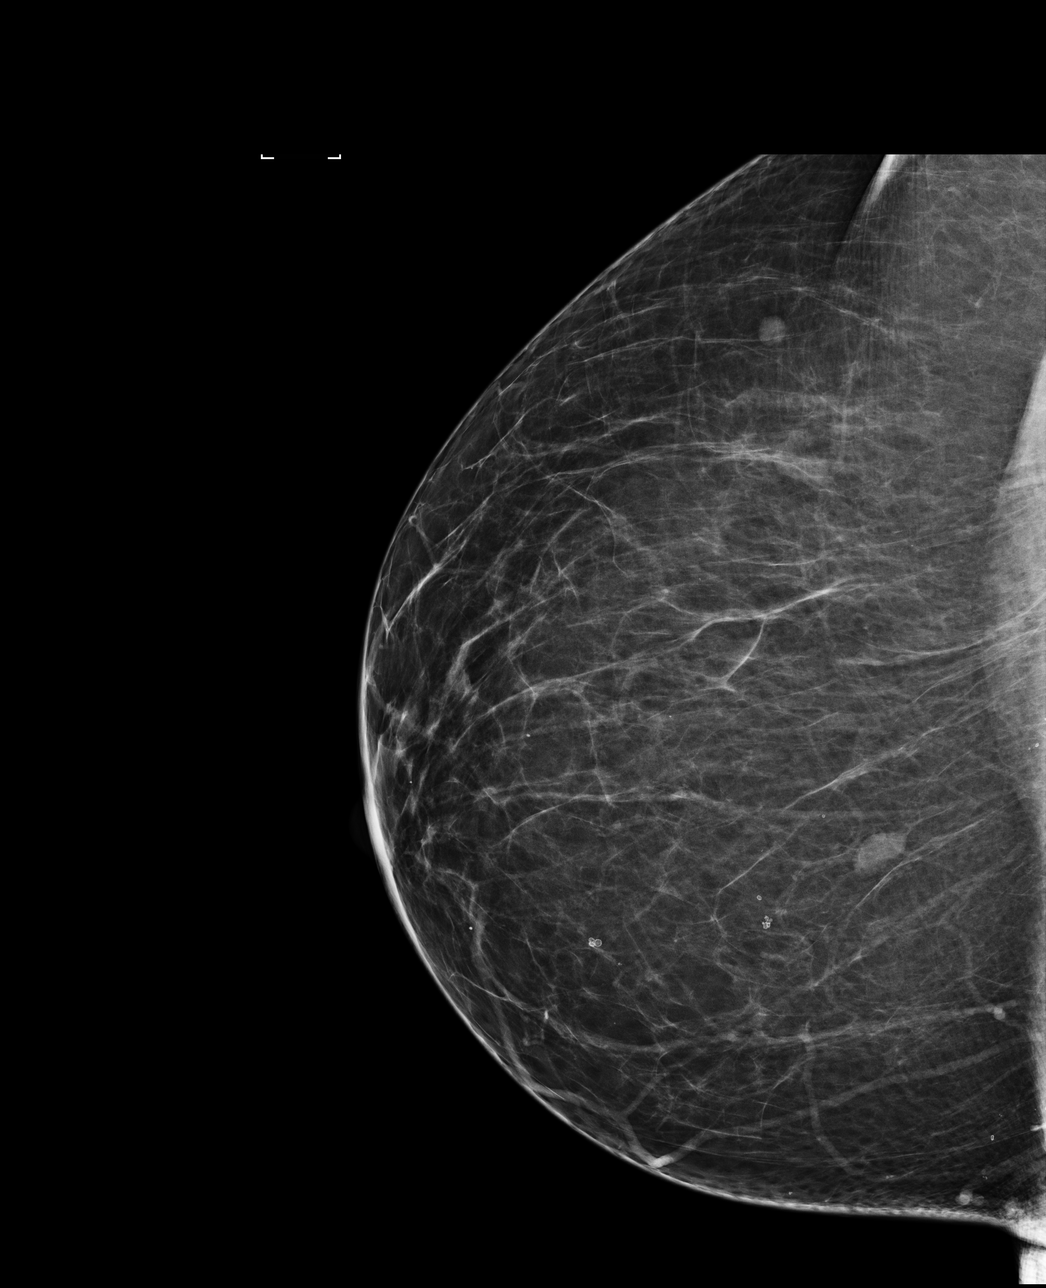

[L CC]
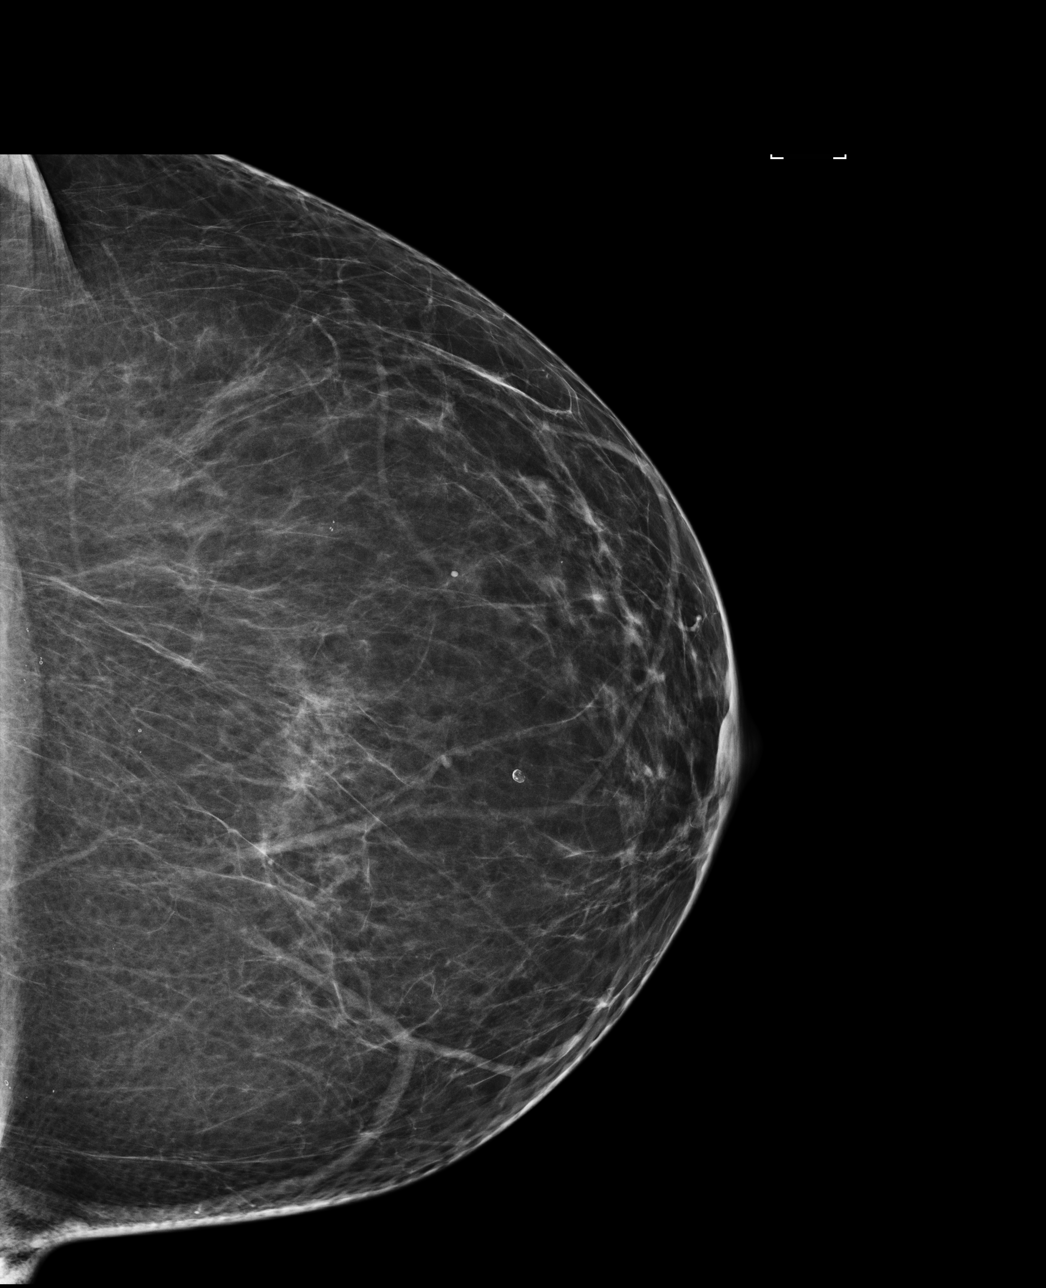

[L MLO]
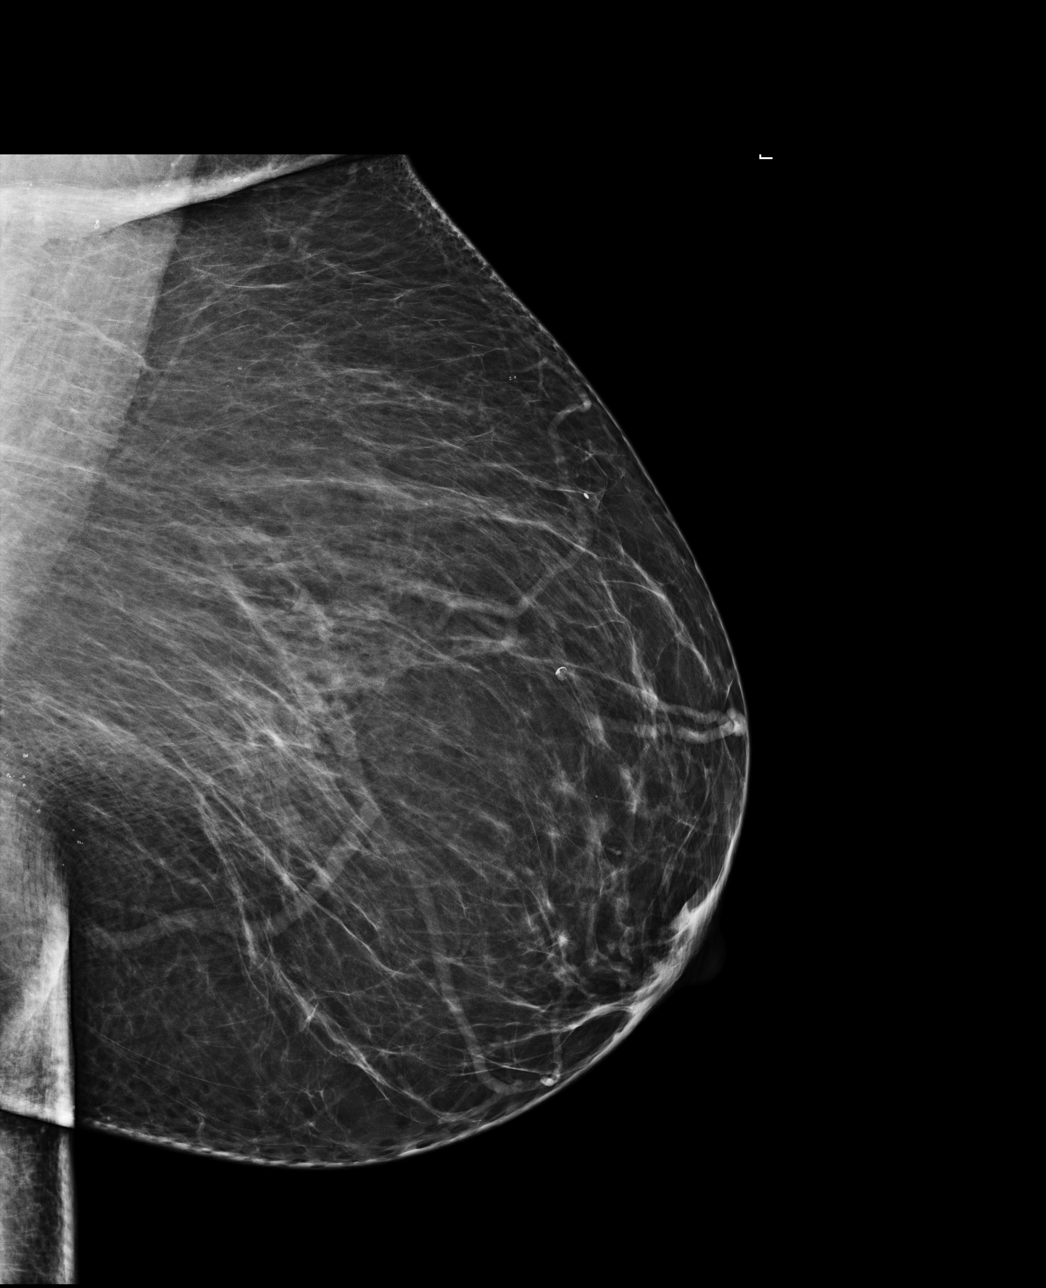

[4 of 4 positions shown; findings below may reference images not displayed]

ACR Breast Density Category b: There are scattered areas of
fibroglandular density.
FINDINGS: There are no findings suspicious for malignancy. The images were
evaluated with computer-aided detection.
IMPRESSION: No mammographic evidence of malignancy. A result letter of this
screening mammogram will be mailed directly to the patient.

RECOMMENDATION:
Screening mammogram in one year. (Code:XC-Q-XW6)

BI-RADS CATEGORY  1: Negative.

## 2022-12-20 ENCOUNTER — Other Ambulatory Visit: Payer: Self-pay | Admitting: Nurse Practitioner

## 2022-12-20 ENCOUNTER — Ambulatory Visit
Admission: RE | Admit: 2022-12-20 | Discharge: 2022-12-20 | Disposition: A | Discharge status: Home / Self Care | Payer: BLUE CROSS/BLUE SHIELD | Source: Ambulatory Visit | Attending: Nurse Practitioner | Admitting: Nurse Practitioner

## 2022-12-20 DIAGNOSIS — M25551 Pain in right hip: Secondary | ICD-10-CM

## 2022-12-30 ENCOUNTER — Ambulatory Visit: Payer: BLUE CROSS/BLUE SHIELD | Attending: Family Medicine | Admitting: Audiologist

## 2022-12-30 DIAGNOSIS — H903 Sensorineural hearing loss, bilateral: Secondary | ICD-10-CM | POA: Diagnosis not present

## 2023-01-03 DIAGNOSIS — K219 Gastro-esophageal reflux disease without esophagitis: Secondary | ICD-10-CM | POA: Diagnosis not present

## 2023-01-03 DIAGNOSIS — M25561 Pain in right knee: Secondary | ICD-10-CM | POA: Diagnosis not present

## 2023-01-03 DIAGNOSIS — A048 Other specified bacterial intestinal infections: Secondary | ICD-10-CM | POA: Diagnosis not present

## 2023-01-03 DIAGNOSIS — E119 Type 2 diabetes mellitus without complications: Secondary | ICD-10-CM | POA: Diagnosis not present

## 2023-01-03 NOTE — Procedures (Signed)
  [facility]patient [facility] [address] [place], [place]  [address] [phone]  AUDIOLOGICAL  EVALUATION  NAME: Angela Mitchell     DOB:   [birth year]      MRN: [id number]                                                                                     DATE: 01/03/2023     REFERENT: Lewis Moccasin, MD STATUS: Outpatient DIAGNOSIS: Sensorineural Hearing Loss Bilateral    History: [name] was seen for an audiological evaluation due to a swishing tinnitus she was hearing for a while. The swishing has now stopped. She feels she hears well. She has no pain, pressure, or tinnitus. She has diabetes. No other medical warning signs for hearing loss. She has no difficulty hearing. She denies any occupational or reactional noise exposure.   Evaluation:  Otoscopy showed a clear view of the tympanic membranes, bilaterally Tympanometry results were consistent with normal middle ear function, bilaterally   Audiometric testing was completed using Conventional Audiometry techniques with insert earphones and TDH headphones. Test results are consistent with normal hearing 250-4kHz dropping to a moderate loss at 6-8kHz only bilaterally. Speech Recognition Thresholds were obtained at 15dB HL in the right ear and at 15dB HL in the left ear. Word Recognition Testing was completed at 55dB HL and Viktoria scored 100% in each ear. Excellent speech understanding at conversational level.    Results:  The test results were reviewed with [facility]. She has normal hearing 250-4kHz dropping to a moderate loss at 6-8kHz only bilaterally. This loss will not impact hearing speech or day to day communication. Still recommend she have annual hearing testing to monitor this loss for progression.   Recommendations: 1.   No follow up needed at this time. Monitor hearing annually due to diabetes with steep loss of high frequencies.    31 minutes spent testing and counseling on results.   If you  have any questions please feel free to contact me at [phone]. Ammie Ferrier Au.D.  Audiologist   01/03/2023  11:02 AM  Cc: Lewis Moccasin, MD

## 2023-01-10 ENCOUNTER — Encounter: Payer: Self-pay | Admitting: *Deleted

## 2023-01-10 NOTE — Progress Notes (Deleted)
PATIENT: Angela Mitchell DOB: 07-Sep-1965  REASON FOR VISIT: follow up HISTORY FROM: patient PRIMARY NEUROLOGIST:   Virtual Visit via Video Note  I connected with Angela Mitchell on 01/11/23 at  2:00 PM EST by a video enabled telemedicine application located remotely at [facility] and verified that I am speaking with the correct person using two identifiers who was located at their own home.   I discussed the limitations of evaluation and management by telemedicine and the availability of in person appointments. The patient expressed understanding and agreed to proceed.   PATIENT: Angela Mitchell DOB: [birth year]  REASON FOR VISIT: follow up HISTORY FROM: patient  HISTORY OF PRESENT ILLNESS:   01/06/22:   Ms. Braff is a 57 year old female with a history of obstructive sleep apnea on CPAP.  She returns today for follow-up.  Her download is below.  She reports that the machine is working well for her.  She denies any new symptoms.  States that she feels well rested during the day.  Not requiring naps or falling asleep unexpectedly.  She states lately she has had a head cold.  For that reason she has not been able to use his CPAP consistently due to nasal congestion.     REVIEW OF SYSTEMS: Out of a complete 14 system review of symptoms, the patient complains only of the following symptoms, and all other reviewed systems are negative.  ALLERGIES: No Known Allergies  HOME MEDICATIONS: Outpatient Medications Prior to Visit  Medication Sig Dispense Refill   albuterol (VENTOLIN HFA) 108 (90 Base) MCG/ACT inhaler Inhale into the lungs every 4 (four) hours as needed.     Ascorbic Acid (VITAMIN C PO) Take by mouth daily.     aspirin EC 81 MG tablet Take 81 mg by mouth daily.     azelastine (ASTELIN) 0.1 % nasal spray Place 2 sprays into both nostrils 2 (two) times daily as needed.      azithromycin (ZITHROMAX) 500 MG tablet Take 500 mg by mouth daily. Take 500mg   day 1, then 250 mg daily for five days     benzonatate (TESSALON) 100 MG capsule Take by mouth 3 (three) times daily as needed for cough.     escitalopram (LEXAPRO) 5 MG tablet Take 3 tablets by mouth daily.     Fluticasone-Salmeterol (ADVAIR) 500-50 MCG/DOSE AEPB Inhale 1 puff into the lungs 2 (two) times daily as needed.     hydrochlorothiazide (HYDRODIURIL) 25 MG tablet Take 12.5 mg by mouth daily.     Insulin Glargine (BASAGLAR KWIKPEN) 100 UNIT/ML Inject 58 Units into the skin daily.     metFORMIN (GLUCOPHAGE-XR) 500 MG 24 hr tablet Take 500 mg by mouth at bedtime.     methocarbamol (ROBAXIN) 500 MG tablet Take 1 tablet (500 mg total) by mouth 2 (two) times daily. 20 tablet 0   montelukast (SINGULAIR) 10 MG tablet Take 10 mg by mouth daily as needed.     Multiple Vitamins-Minerals (ZINC PO) Take by mouth.     OZEMPIC, 1 MG/DOSE, 2 MG/1.5ML SOPN Inject 1 mg into the skin once a week. Sunday's     rOPINIRole (REQUIP) 0.25 MG tablet Take 0.5 mg by mouth at bedtime as needed.     rosuvastatin (CRESTOR) 20 MG tablet Take 20 mg by mouth daily.     VITAMIN D, CHOLECALCIFEROL, PO Take 5,000 Units by mouth daily.     No facility-administered medications prior to visit.    PAST  MEDICAL HISTORY: Past Medical History:  Diagnosis Date   Anxiety    Asthma    followed by pcp---  (04-27-2021  pt stated uses advair/ rescue inhaler as needed, last used 01/ 2021)   Carpal tunnel syndrome on both sides    Eczema    GERD (gastroesophageal reflux disease)    History of chlamydia    History of COVID-19 03/2019   per pt mild to moderate symptoms that resolved   History of gonorrhea    History of herpes genitalis    Hyperlipidemia    Hypertension    Intermittent palpitations    per pt told from anxiety,  takes lexapro;  previously seen by cardiology-- dr Arnell Sieving, per lov note in epic 11-27-2018 work-up done , echo and nuclear stress test unremarkable and monitor show SR/ ST  w/ occasional PAC/ PVC  (lov note in epic 11-27-2018)   OSA on CPAP    followed by [facility];  study in epic 07-18-2019  severe osa   RLS (restless legs syndrome)    Sickle cell trait (HCC)    Type 2 diabetes mellitus treated with insulin (HCC)    followed by pcp;  (04-27-2021  stated check blood sugar with [name] multiple times daily,  fasting sugar--- 120s)   Wears glasses     PAST SURGICAL HISTORY: Past Surgical History:  Procedure Laterality Date   CARPAL TUNNEL RELEASE Right 04/30/2021   Procedure: CARPAL TUNNEL RELEASE;  Surgeon: Gomez Cleverly, MD;  Location: [facility] [place];  Service: Orthopedics;  Laterality: Right;  with local anesthesia   CESAREAN SECTION  12/27/2003   @[facility]    DILATION AND CURETTAGE OF UTERUS  2013   missed ab   WISDOM TOOTH EXTRACTION      FAMILY HISTORY: Family History  Problem Relation Age of Onset   Diabetes Mother    Hypertension Father    Hyperlipidemia Father    Diabetes Father    Diabetes Maternal Grandmother    Sleep apnea Neg Hx     SOCIAL HISTORY: Social History   Socioeconomic History   Marital status: Married    Spouse name: Not on file   Number of children: 2   Years of education: Not on file   Highest education level: Not on file  Occupational History   Not on file  Tobacco Use   Smoking status: Never   Smokeless tobacco: Never  Vaping Use   Vaping status: Never Used  Substance and Sexual Activity   Alcohol use: No   Drug use: Never   Sexual activity: Yes    Birth control/protection: None    Comment: premenopausal  Other Topics Concern   Not on file  Social History Narrative   Not on file   Social Determinants of Health   Financial Resource Strain: Not on file  Food Insecurity: Not on file  Transportation Needs: Not on file  Physical Activity: Not on file  Stress: Not on file  Social Connections: Not on file  Intimate Partner Violence: Not on file      PHYSICAL EXAM Generalized: Well developed, in no acute distress    Neurological examination  Mentation: Alert oriented to time, place, history taking. Follows all commands speech and language fluent Cranial nerve II-XII:Extraocular movements were full. Facial symmetry noted. uvula tongue midline. Head turning and shoulder shrug  were normal and symmetric. Motor: Good strength throughout subjectively per patient Sensory: Sensory testing is intact to soft touch on all 4 extremities subjectively per patient Coordination: Cerebellar  testing reveals good finger-nose-finger  Gait and station: Patient is able to stand from a seated position. gait is normal.  Reflexes: UTA  DIAGNOSTIC DATA (LABS, IMAGING, TESTING) - I reviewed patient records, labs, notes, testing and imaging myself where available.  Lab Results  Component Value Date   WBC 11.1 (H) 04/03/2011   HGB 12.2 04/30/2021   HCT 36.0 04/30/2021   MCV 80.5 04/03/2011   PLT 401 (H) 04/03/2011      Component Value Date/Time   NA 139 04/30/2021 0630   K 4.6 04/30/2021 0630   CL 103 04/30/2021 0630   CO2 26 05/13/2010 0813   GLUCOSE 108 (H) 04/30/2021 0630   BUN 16 04/30/2021 0630   CREATININE 0.60 04/30/2021 0630   CALCIUM 8.7 05/13/2010 0813   PROT 7.2 05/13/2010 0813   ALBUMIN 3.4 (L) 05/13/2010 0813   AST 22 05/13/2010 0813   ALT 19 05/13/2010 0813   ALKPHOS 46 05/13/2010 0813   BILITOT 0.3 05/13/2010 0813   GFRNONAA >60 05/13/2010 0813   GFRAA  05/13/2010 0813    >60        The eGFR has been calculated using the MDRD equation. This calculation has not been validated in all clinical situations. eGFR's persistently <60 mL/min signify possible Chronic Kidney Disease.   No results found for: "CHOL", "HDL", "LDLCALC", "LDLDIRECT", "TRIG", "CHOLHDL" No results found for: "HGBA1C" No results found for: "VITAMINB12" No results found for: "TSH"    ASSESSMENT AND PLAN 57 y.o. year old female  has a past medical history of Anxiety, Asthma, Carpal tunnel syndrome on both sides,  Eczema, GERD (gastroesophageal reflux disease), History of chlamydia, History of COVID-19 (03/2019), History of gonorrhea, History of herpes genitalis, Hyperlipidemia, Hypertension, Intermittent palpitations, OSA on CPAP, RLS (restless legs syndrome), Sickle cell trait (HCC), Type 2 diabetes mellitus treated with insulin (HCC), and Wears glasses. here with:  OSA on CPAP  CPAP compliance excellent Residual AHI is good Encouraged patient to continue using CPAP nightly and > 4 hours each night F/U in 1 year or sooner if needed  I spent 20 minutes of face-to-face and non-face-to-face time with patient.  This included previsit chart review, lab review, study review, order entry, electronic health record documentation, patient education.  Butch Penny, MSN, NP-C 01/11/2023, 12:17 PM [facility] [address], [address] [place], [place] [address] [phone]

## 2023-01-11 ENCOUNTER — Telehealth: Payer: BLUE CROSS/BLUE SHIELD | Admitting: Adult Health

## 2023-01-13 DIAGNOSIS — M170 Bilateral primary osteoarthritis of knee: Secondary | ICD-10-CM | POA: Diagnosis not present

## 2023-02-16 DIAGNOSIS — E1121 Type 2 diabetes mellitus with diabetic nephropathy: Secondary | ICD-10-CM | POA: Diagnosis not present

## 2023-02-16 DIAGNOSIS — I129 Hypertensive chronic kidney disease with stage 1 through stage 4 chronic kidney disease, or unspecified chronic kidney disease: Secondary | ICD-10-CM | POA: Diagnosis not present

## 2023-02-16 DIAGNOSIS — E1122 Type 2 diabetes mellitus with diabetic chronic kidney disease: Secondary | ICD-10-CM | POA: Diagnosis not present

## 2023-02-16 DIAGNOSIS — E1165 Type 2 diabetes mellitus with hyperglycemia: Secondary | ICD-10-CM | POA: Diagnosis not present

## 2023-03-31 DIAGNOSIS — E782 Mixed hyperlipidemia: Secondary | ICD-10-CM | POA: Diagnosis not present

## 2023-03-31 DIAGNOSIS — I10 Essential (primary) hypertension: Secondary | ICD-10-CM | POA: Diagnosis not present

## 2023-03-31 DIAGNOSIS — Z789 Other specified health status: Secondary | ICD-10-CM | POA: Diagnosis not present

## 2023-03-31 DIAGNOSIS — G4700 Insomnia, unspecified: Secondary | ICD-10-CM | POA: Diagnosis not present

## 2023-04-06 ENCOUNTER — Other Ambulatory Visit: Payer: Self-pay | Admitting: Family Medicine

## 2023-04-06 DIAGNOSIS — Z1231 Encounter for screening mammogram for malignant neoplasm of breast: Secondary | ICD-10-CM

## 2023-04-14 DIAGNOSIS — E1165 Type 2 diabetes mellitus with hyperglycemia: Secondary | ICD-10-CM | POA: Diagnosis not present

## 2023-04-14 DIAGNOSIS — R110 Nausea: Secondary | ICD-10-CM | POA: Diagnosis not present

## 2023-04-14 DIAGNOSIS — K219 Gastro-esophageal reflux disease without esophagitis: Secondary | ICD-10-CM | POA: Diagnosis not present

## 2023-04-25 ENCOUNTER — Ambulatory Visit
Admission: RE | Admit: 2023-04-25 | Discharge: 2023-04-25 | Disposition: A | Discharge status: Home / Self Care | Payer: BLUE CROSS/BLUE SHIELD | Source: Ambulatory Visit | Attending: Family Medicine | Admitting: Family Medicine

## 2023-04-25 DIAGNOSIS — Z1231 Encounter for screening mammogram for malignant neoplasm of breast: Secondary | ICD-10-CM

## 2023-04-26 ENCOUNTER — Other Ambulatory Visit (HOSPITAL_COMMUNITY): Payer: Self-pay

## 2023-04-28 ENCOUNTER — Other Ambulatory Visit (HOSPITAL_COMMUNITY): Payer: Self-pay

## 2023-04-28 MED ORDER — DEXCOM G7 SENSOR MISC
11 refills | Status: AC
  Filled 2023-04-28: qty 3, 30d supply, fill #0
  Filled 2023-05-30: qty 3, 30d supply, fill #1
  Filled 2023-07-04: qty 3, 30d supply, fill #2
  Filled 2023-08-06: qty 3, 30d supply, fill #3
  Filled 2023-09-05 – 2023-11-14 (×4): qty 3, 30d supply, fill #0

## 2023-04-29 ENCOUNTER — Other Ambulatory Visit (HOSPITAL_COMMUNITY): Payer: Self-pay

## 2023-05-30 ENCOUNTER — Other Ambulatory Visit (HOSPITAL_COMMUNITY): Payer: Self-pay

## 2023-06-16 DIAGNOSIS — R739 Hyperglycemia, unspecified: Secondary | ICD-10-CM | POA: Diagnosis not present

## 2023-06-20 DIAGNOSIS — J309 Allergic rhinitis, unspecified: Secondary | ICD-10-CM | POA: Diagnosis not present

## 2023-06-20 DIAGNOSIS — M25521 Pain in right elbow: Secondary | ICD-10-CM | POA: Diagnosis not present

## 2023-06-20 DIAGNOSIS — E1165 Type 2 diabetes mellitus with hyperglycemia: Secondary | ICD-10-CM | POA: Diagnosis not present

## 2023-06-29 DIAGNOSIS — M542 Cervicalgia: Secondary | ICD-10-CM | POA: Diagnosis not present

## 2023-06-29 DIAGNOSIS — M25521 Pain in right elbow: Secondary | ICD-10-CM | POA: Diagnosis not present

## 2023-07-04 ENCOUNTER — Other Ambulatory Visit: Payer: Self-pay

## 2023-07-04 ENCOUNTER — Other Ambulatory Visit (HOSPITAL_COMMUNITY): Payer: Self-pay

## 2023-07-11 DIAGNOSIS — M7711 Lateral epicondylitis, right elbow: Secondary | ICD-10-CM | POA: Diagnosis not present

## 2023-07-11 DIAGNOSIS — M79672 Pain in left foot: Secondary | ICD-10-CM | POA: Diagnosis not present

## 2023-07-19 DIAGNOSIS — M25521 Pain in right elbow: Secondary | ICD-10-CM | POA: Diagnosis not present

## 2023-08-01 DIAGNOSIS — M25521 Pain in right elbow: Secondary | ICD-10-CM | POA: Diagnosis not present

## 2023-08-01 DIAGNOSIS — E1165 Type 2 diabetes mellitus with hyperglycemia: Secondary | ICD-10-CM | POA: Diagnosis not present

## 2023-08-01 DIAGNOSIS — G4700 Insomnia, unspecified: Secondary | ICD-10-CM | POA: Diagnosis not present

## 2023-08-01 DIAGNOSIS — K219 Gastro-esophageal reflux disease without esophagitis: Secondary | ICD-10-CM | POA: Diagnosis not present

## 2023-08-01 DIAGNOSIS — J4540 Moderate persistent asthma, uncomplicated: Secondary | ICD-10-CM | POA: Diagnosis not present

## 2023-08-03 DIAGNOSIS — M2022 Hallux rigidus, left foot: Secondary | ICD-10-CM | POA: Diagnosis not present

## 2023-08-06 ENCOUNTER — Other Ambulatory Visit (HOSPITAL_COMMUNITY): Payer: Self-pay

## 2023-08-08 DIAGNOSIS — M25521 Pain in right elbow: Secondary | ICD-10-CM | POA: Diagnosis not present

## 2023-08-15 DIAGNOSIS — M25521 Pain in right elbow: Secondary | ICD-10-CM | POA: Diagnosis not present

## 2023-08-22 DIAGNOSIS — M25521 Pain in right elbow: Secondary | ICD-10-CM | POA: Diagnosis not present

## 2023-08-22 DIAGNOSIS — G8929 Other chronic pain: Secondary | ICD-10-CM | POA: Diagnosis not present

## 2023-08-29 DIAGNOSIS — M25521 Pain in right elbow: Secondary | ICD-10-CM | POA: Diagnosis not present

## 2023-09-05 ENCOUNTER — Other Ambulatory Visit (HOSPITAL_COMMUNITY): Payer: Self-pay

## 2023-09-05 ENCOUNTER — Other Ambulatory Visit: Payer: Self-pay

## 2023-09-05 DIAGNOSIS — M25521 Pain in right elbow: Secondary | ICD-10-CM | POA: Diagnosis not present

## 2023-09-06 ENCOUNTER — Other Ambulatory Visit: Payer: Self-pay

## 2023-09-07 ENCOUNTER — Other Ambulatory Visit (HOSPITAL_COMMUNITY): Payer: Self-pay

## 2023-09-07 ENCOUNTER — Other Ambulatory Visit: Payer: Self-pay

## 2023-09-08 ENCOUNTER — Other Ambulatory Visit: Payer: Self-pay

## 2023-09-09 ENCOUNTER — Other Ambulatory Visit (HOSPITAL_COMMUNITY): Payer: Self-pay

## 2023-09-12 ENCOUNTER — Other Ambulatory Visit: Payer: Self-pay

## 2023-09-12 DIAGNOSIS — M25521 Pain in right elbow: Secondary | ICD-10-CM | POA: Diagnosis not present

## 2023-09-13 ENCOUNTER — Other Ambulatory Visit: Payer: Self-pay

## 2023-09-14 DIAGNOSIS — M2022 Hallux rigidus, left foot: Secondary | ICD-10-CM | POA: Diagnosis not present

## 2023-09-15 ENCOUNTER — Other Ambulatory Visit: Payer: Self-pay

## 2023-09-20 ENCOUNTER — Other Ambulatory Visit: Payer: Self-pay

## 2023-09-20 DIAGNOSIS — D649 Anemia, unspecified: Secondary | ICD-10-CM | POA: Diagnosis not present

## 2023-09-20 DIAGNOSIS — R739 Hyperglycemia, unspecified: Secondary | ICD-10-CM | POA: Diagnosis not present

## 2023-09-20 DIAGNOSIS — E559 Vitamin D deficiency, unspecified: Secondary | ICD-10-CM | POA: Diagnosis not present

## 2023-09-20 DIAGNOSIS — I10 Essential (primary) hypertension: Secondary | ICD-10-CM | POA: Diagnosis not present

## 2023-09-20 DIAGNOSIS — M25621 Stiffness of right elbow, not elsewhere classified: Secondary | ICD-10-CM | POA: Diagnosis not present

## 2023-09-22 ENCOUNTER — Other Ambulatory Visit: Payer: Self-pay

## 2023-09-24 ENCOUNTER — Other Ambulatory Visit: Payer: Self-pay

## 2023-09-26 ENCOUNTER — Other Ambulatory Visit: Payer: Self-pay

## 2023-09-26 DIAGNOSIS — M25621 Stiffness of right elbow, not elsewhere classified: Secondary | ICD-10-CM | POA: Diagnosis not present

## 2023-09-26 DIAGNOSIS — E1165 Type 2 diabetes mellitus with hyperglycemia: Secondary | ICD-10-CM | POA: Diagnosis not present

## 2023-09-26 DIAGNOSIS — E113292 Type 2 diabetes mellitus with mild nonproliferative diabetic retinopathy without macular edema, left eye: Secondary | ICD-10-CM | POA: Diagnosis not present

## 2023-09-26 DIAGNOSIS — E1169 Type 2 diabetes mellitus with other specified complication: Secondary | ICD-10-CM | POA: Diagnosis not present

## 2023-09-26 DIAGNOSIS — E559 Vitamin D deficiency, unspecified: Secondary | ICD-10-CM | POA: Diagnosis not present

## 2023-10-04 ENCOUNTER — Other Ambulatory Visit: Payer: Self-pay

## 2023-10-11 ENCOUNTER — Other Ambulatory Visit: Payer: Self-pay

## 2023-10-12 DIAGNOSIS — S63509A Unspecified sprain of unspecified wrist, initial encounter: Secondary | ICD-10-CM | POA: Diagnosis not present

## 2023-10-12 DIAGNOSIS — I10 Essential (primary) hypertension: Secondary | ICD-10-CM | POA: Diagnosis not present

## 2023-10-12 DIAGNOSIS — Z23 Encounter for immunization: Secondary | ICD-10-CM | POA: Diagnosis not present

## 2023-10-12 DIAGNOSIS — M545 Low back pain, unspecified: Secondary | ICD-10-CM | POA: Diagnosis not present

## 2023-10-12 DIAGNOSIS — W19XXXA Unspecified fall, initial encounter: Secondary | ICD-10-CM | POA: Diagnosis not present

## 2023-10-14 ENCOUNTER — Other Ambulatory Visit: Payer: Self-pay

## 2023-10-17 ENCOUNTER — Other Ambulatory Visit: Payer: Self-pay

## 2023-10-18 ENCOUNTER — Other Ambulatory Visit: Payer: Self-pay

## 2023-10-19 ENCOUNTER — Other Ambulatory Visit: Payer: Self-pay

## 2023-10-25 ENCOUNTER — Other Ambulatory Visit: Payer: Self-pay

## 2023-10-26 ENCOUNTER — Other Ambulatory Visit: Payer: Self-pay

## 2023-10-28 DIAGNOSIS — I10 Essential (primary) hypertension: Secondary | ICD-10-CM | POA: Diagnosis not present

## 2023-10-28 DIAGNOSIS — R296 Repeated falls: Secondary | ICD-10-CM | POA: Diagnosis not present

## 2023-10-28 DIAGNOSIS — M545 Low back pain, unspecified: Secondary | ICD-10-CM | POA: Diagnosis not present

## 2023-10-28 DIAGNOSIS — M546 Pain in thoracic spine: Secondary | ICD-10-CM | POA: Diagnosis not present

## 2023-11-03 ENCOUNTER — Other Ambulatory Visit: Payer: Self-pay

## 2023-11-09 ENCOUNTER — Other Ambulatory Visit (HOSPITAL_COMMUNITY): Payer: Self-pay

## 2023-11-14 ENCOUNTER — Other Ambulatory Visit (HOSPITAL_COMMUNITY): Payer: Self-pay

## 2023-11-15 DIAGNOSIS — R296 Repeated falls: Secondary | ICD-10-CM | POA: Diagnosis not present

## 2023-11-15 DIAGNOSIS — M170 Bilateral primary osteoarthritis of knee: Secondary | ICD-10-CM | POA: Diagnosis not present

## 2023-11-15 DIAGNOSIS — M5451 Vertebrogenic low back pain: Secondary | ICD-10-CM | POA: Diagnosis not present

## 2023-11-15 DIAGNOSIS — M546 Pain in thoracic spine: Secondary | ICD-10-CM | POA: Diagnosis not present

## 2023-11-18 DIAGNOSIS — J069 Acute upper respiratory infection, unspecified: Secondary | ICD-10-CM | POA: Diagnosis not present

## 2023-11-18 DIAGNOSIS — R197 Diarrhea, unspecified: Secondary | ICD-10-CM | POA: Diagnosis not present

## 2023-11-18 DIAGNOSIS — J329 Chronic sinusitis, unspecified: Secondary | ICD-10-CM | POA: Diagnosis not present

## 2023-11-18 DIAGNOSIS — B9689 Other specified bacterial agents as the cause of diseases classified elsewhere: Secondary | ICD-10-CM | POA: Diagnosis not present

## 2023-12-26 DIAGNOSIS — I10 Essential (primary) hypertension: Secondary | ICD-10-CM | POA: Diagnosis not present

## 2023-12-26 DIAGNOSIS — R739 Hyperglycemia, unspecified: Secondary | ICD-10-CM | POA: Diagnosis not present

## 2023-12-30 DIAGNOSIS — E1165 Type 2 diabetes mellitus with hyperglycemia: Secondary | ICD-10-CM | POA: Diagnosis not present

## 2023-12-30 DIAGNOSIS — I10 Essential (primary) hypertension: Secondary | ICD-10-CM | POA: Diagnosis not present

## 2023-12-30 DIAGNOSIS — E1121 Type 2 diabetes mellitus with diabetic nephropathy: Secondary | ICD-10-CM | POA: Diagnosis not present

## 2024-02-10 DIAGNOSIS — M26629 Arthralgia of temporomandibular joint, unspecified side: Secondary | ICD-10-CM | POA: Diagnosis not present

## 2024-02-10 DIAGNOSIS — Z7984 Long term (current) use of oral hypoglycemic drugs: Secondary | ICD-10-CM | POA: Diagnosis not present

## 2024-02-10 DIAGNOSIS — E11319 Type 2 diabetes mellitus with unspecified diabetic retinopathy without macular edema: Secondary | ICD-10-CM | POA: Diagnosis not present

## 2024-02-10 DIAGNOSIS — E1165 Type 2 diabetes mellitus with hyperglycemia: Secondary | ICD-10-CM | POA: Diagnosis not present
